# Patient Record
Sex: Male | Born: 1945 | Race: White | Hispanic: No | Marital: Married | Smoking: Never smoker
Health system: Southern US, Community
[De-identification: ages and names within clinical notes are randomized; demographics above are authoritative.]

## PROBLEM LIST (undated history)

## (undated) HISTORY — PX: OTHER SURGICAL HISTORY: SHX169

## (undated) HISTORY — PX: KNEE ARTHROSCOPY: SUR90

---

## 2004-08-31 ENCOUNTER — Encounter: Admission: RE | Admit: 2004-08-31 | Discharge: 2004-08-31 | Payer: Self-pay | Admitting: Unknown Physician Specialty

## 2008-06-20 ENCOUNTER — Encounter: Admission: RE | Admit: 2008-06-20 | Discharge: 2008-06-20 | Payer: Self-pay | Admitting: Unknown Physician Specialty

## 2012-05-11 DIAGNOSIS — R972 Elevated prostate specific antigen [PSA]: Secondary | ICD-10-CM

## 2012-05-11 HISTORY — DX: Elevated prostate specific antigen (PSA): R97.20

## 2014-01-20 ENCOUNTER — Other Ambulatory Visit: Payer: Self-pay | Admitting: Urology

## 2014-01-20 DIAGNOSIS — N183 Chronic kidney disease, stage 3 unspecified: Secondary | ICD-10-CM

## 2014-01-20 DIAGNOSIS — N200 Calculus of kidney: Secondary | ICD-10-CM

## 2014-01-20 HISTORY — DX: Chronic kidney disease, stage 3 unspecified: N18.30

## 2014-01-28 ENCOUNTER — Ambulatory Visit
Admission: RE | Admit: 2014-01-28 | Discharge: 2014-01-28 | Disposition: A | Discharge status: Home / Self Care | Payer: Medicare Other | Source: Ambulatory Visit | Attending: Urology | Admitting: Urology

## 2014-01-28 DIAGNOSIS — N200 Calculus of kidney: Secondary | ICD-10-CM

## 2014-03-04 DIAGNOSIS — R001 Bradycardia, unspecified: Secondary | ICD-10-CM

## 2014-03-04 HISTORY — DX: Bradycardia, unspecified: R00.1

## 2014-05-12 ENCOUNTER — Other Ambulatory Visit: Payer: Self-pay | Admitting: Urology

## 2014-05-12 DIAGNOSIS — N200 Calculus of kidney: Secondary | ICD-10-CM

## 2014-05-14 ENCOUNTER — Ambulatory Visit
Admission: RE | Admit: 2014-05-14 | Discharge: 2014-05-14 | Disposition: A | Discharge status: Home / Self Care | Payer: Medicare Other | Source: Ambulatory Visit | Attending: Urology | Admitting: Urology

## 2014-05-14 DIAGNOSIS — N200 Calculus of kidney: Secondary | ICD-10-CM

## 2014-11-10 DIAGNOSIS — N411 Chronic prostatitis: Secondary | ICD-10-CM | POA: Insufficient documentation

## 2014-11-10 DIAGNOSIS — N401 Enlarged prostate with lower urinary tract symptoms: Secondary | ICD-10-CM | POA: Insufficient documentation

## 2014-11-10 HISTORY — DX: Chronic prostatitis: N41.1

## 2014-11-10 HISTORY — DX: Benign prostatic hyperplasia with lower urinary tract symptoms: N40.1

## 2015-03-27 ENCOUNTER — Other Ambulatory Visit: Payer: Self-pay | Admitting: Specialist

## 2015-03-27 DIAGNOSIS — M5416 Radiculopathy, lumbar region: Secondary | ICD-10-CM

## 2015-03-30 ENCOUNTER — Other Ambulatory Visit: Payer: Self-pay | Admitting: Specialist

## 2015-03-30 DIAGNOSIS — M5416 Radiculopathy, lumbar region: Secondary | ICD-10-CM

## 2015-04-07 ENCOUNTER — Ambulatory Visit
Admission: RE | Admit: 2015-04-07 | Discharge: 2015-04-07 | Disposition: A | Discharge status: Home / Self Care | Payer: PPO | Source: Ambulatory Visit | Attending: Specialist | Admitting: Specialist

## 2015-04-07 DIAGNOSIS — M5416 Radiculopathy, lumbar region: Secondary | ICD-10-CM

## 2015-10-27 DIAGNOSIS — H5203 Hypermetropia, bilateral: Secondary | ICD-10-CM | POA: Diagnosis not present

## 2015-10-27 DIAGNOSIS — H2513 Age-related nuclear cataract, bilateral: Secondary | ICD-10-CM | POA: Diagnosis not present

## 2015-11-10 DIAGNOSIS — E559 Vitamin D deficiency, unspecified: Secondary | ICD-10-CM | POA: Diagnosis not present

## 2015-11-10 DIAGNOSIS — E531 Pyridoxine deficiency: Secondary | ICD-10-CM | POA: Diagnosis not present

## 2015-11-10 DIAGNOSIS — M6281 Muscle weakness (generalized): Secondary | ICD-10-CM | POA: Diagnosis not present

## 2015-11-10 DIAGNOSIS — G603 Idiopathic progressive neuropathy: Secondary | ICD-10-CM | POA: Diagnosis not present

## 2015-11-10 DIAGNOSIS — Z79899 Other long term (current) drug therapy: Secondary | ICD-10-CM | POA: Diagnosis not present

## 2015-11-10 DIAGNOSIS — M791 Myalgia: Secondary | ICD-10-CM | POA: Diagnosis not present

## 2015-11-10 DIAGNOSIS — M797 Fibromyalgia: Secondary | ICD-10-CM | POA: Diagnosis not present

## 2015-11-16 DIAGNOSIS — L821 Other seborrheic keratosis: Secondary | ICD-10-CM | POA: Diagnosis not present

## 2015-11-16 DIAGNOSIS — L02212 Cutaneous abscess of back [any part, except buttock]: Secondary | ICD-10-CM | POA: Diagnosis not present

## 2015-11-16 DIAGNOSIS — D1801 Hemangioma of skin and subcutaneous tissue: Secondary | ICD-10-CM | POA: Diagnosis not present

## 2015-11-23 DIAGNOSIS — N400 Enlarged prostate without lower urinary tract symptoms: Secondary | ICD-10-CM | POA: Diagnosis not present

## 2015-11-23 DIAGNOSIS — N183 Chronic kidney disease, stage 3 (moderate): Secondary | ICD-10-CM | POA: Diagnosis not present

## 2015-11-23 DIAGNOSIS — N200 Calculus of kidney: Secondary | ICD-10-CM | POA: Diagnosis not present

## 2015-11-23 DIAGNOSIS — R972 Elevated prostate specific antigen [PSA]: Secondary | ICD-10-CM | POA: Diagnosis not present

## 2015-12-09 DIAGNOSIS — R6889 Other general symptoms and signs: Secondary | ICD-10-CM | POA: Diagnosis not present

## 2015-12-22 DIAGNOSIS — G603 Idiopathic progressive neuropathy: Secondary | ICD-10-CM | POA: Diagnosis not present

## 2015-12-22 DIAGNOSIS — M791 Myalgia: Secondary | ICD-10-CM | POA: Diagnosis not present

## 2016-01-26 DIAGNOSIS — G603 Idiopathic progressive neuropathy: Secondary | ICD-10-CM | POA: Diagnosis not present

## 2016-01-26 DIAGNOSIS — M791 Myalgia: Secondary | ICD-10-CM | POA: Diagnosis not present

## 2016-01-26 DIAGNOSIS — G2581 Restless legs syndrome: Secondary | ICD-10-CM | POA: Diagnosis not present

## 2016-01-26 DIAGNOSIS — M5417 Radiculopathy, lumbosacral region: Secondary | ICD-10-CM | POA: Diagnosis not present

## 2016-02-26 DIAGNOSIS — H903 Sensorineural hearing loss, bilateral: Secondary | ICD-10-CM | POA: Diagnosis not present

## 2016-02-26 DIAGNOSIS — H9313 Tinnitus, bilateral: Secondary | ICD-10-CM | POA: Diagnosis not present

## 2016-02-26 DIAGNOSIS — H6123 Impacted cerumen, bilateral: Secondary | ICD-10-CM | POA: Diagnosis not present

## 2016-05-17 DIAGNOSIS — Z1211 Encounter for screening for malignant neoplasm of colon: Secondary | ICD-10-CM | POA: Diagnosis not present

## 2016-05-17 DIAGNOSIS — K210 Gastro-esophageal reflux disease with esophagitis: Secondary | ICD-10-CM | POA: Diagnosis not present

## 2016-05-27 DIAGNOSIS — K5730 Diverticulosis of large intestine without perforation or abscess without bleeding: Secondary | ICD-10-CM | POA: Diagnosis not present

## 2016-05-27 DIAGNOSIS — K219 Gastro-esophageal reflux disease without esophagitis: Secondary | ICD-10-CM | POA: Diagnosis not present

## 2016-05-27 DIAGNOSIS — K2900 Acute gastritis without bleeding: Secondary | ICD-10-CM | POA: Diagnosis not present

## 2016-05-27 DIAGNOSIS — K208 Other esophagitis: Secondary | ICD-10-CM | POA: Diagnosis not present

## 2016-05-27 DIAGNOSIS — Z1211 Encounter for screening for malignant neoplasm of colon: Secondary | ICD-10-CM | POA: Diagnosis not present

## 2016-05-27 DIAGNOSIS — K209 Esophagitis, unspecified: Secondary | ICD-10-CM | POA: Diagnosis not present

## 2016-05-27 DIAGNOSIS — K649 Unspecified hemorrhoids: Secondary | ICD-10-CM | POA: Diagnosis not present

## 2016-05-27 DIAGNOSIS — K641 Second degree hemorrhoids: Secondary | ICD-10-CM | POA: Diagnosis not present

## 2016-07-08 DIAGNOSIS — N183 Chronic kidney disease, stage 3 (moderate): Secondary | ICD-10-CM | POA: Diagnosis not present

## 2016-07-08 DIAGNOSIS — R972 Elevated prostate specific antigen [PSA]: Secondary | ICD-10-CM | POA: Diagnosis not present

## 2016-07-08 DIAGNOSIS — N401 Enlarged prostate with lower urinary tract symptoms: Secondary | ICD-10-CM | POA: Diagnosis not present

## 2016-07-08 DIAGNOSIS — N200 Calculus of kidney: Secondary | ICD-10-CM | POA: Diagnosis not present

## 2016-07-12 DIAGNOSIS — M159 Polyosteoarthritis, unspecified: Secondary | ICD-10-CM | POA: Diagnosis not present

## 2016-07-12 DIAGNOSIS — G2581 Restless legs syndrome: Secondary | ICD-10-CM | POA: Diagnosis not present

## 2016-07-12 DIAGNOSIS — N401 Enlarged prostate with lower urinary tract symptoms: Secondary | ICD-10-CM | POA: Diagnosis not present

## 2016-07-12 DIAGNOSIS — Z1159 Encounter for screening for other viral diseases: Secondary | ICD-10-CM | POA: Diagnosis not present

## 2016-07-12 DIAGNOSIS — Z125 Encounter for screening for malignant neoplasm of prostate: Secondary | ICD-10-CM | POA: Diagnosis not present

## 2016-07-12 DIAGNOSIS — D509 Iron deficiency anemia, unspecified: Secondary | ICD-10-CM | POA: Diagnosis not present

## 2016-07-12 DIAGNOSIS — Z9181 History of falling: Secondary | ICD-10-CM | POA: Diagnosis not present

## 2016-07-12 DIAGNOSIS — N181 Chronic kidney disease, stage 1: Secondary | ICD-10-CM | POA: Diagnosis not present

## 2016-07-12 DIAGNOSIS — K210 Gastro-esophageal reflux disease with esophagitis: Secondary | ICD-10-CM | POA: Diagnosis not present

## 2016-07-12 DIAGNOSIS — Z0000 Encounter for general adult medical examination without abnormal findings: Secondary | ICD-10-CM | POA: Diagnosis not present

## 2016-07-12 DIAGNOSIS — Z23 Encounter for immunization: Secondary | ICD-10-CM | POA: Diagnosis not present

## 2016-07-12 DIAGNOSIS — E782 Mixed hyperlipidemia: Secondary | ICD-10-CM | POA: Diagnosis not present

## 2016-07-12 DIAGNOSIS — G609 Hereditary and idiopathic neuropathy, unspecified: Secondary | ICD-10-CM | POA: Diagnosis not present

## 2016-08-09 DIAGNOSIS — G2581 Restless legs syndrome: Secondary | ICD-10-CM | POA: Diagnosis not present

## 2016-08-09 DIAGNOSIS — M5417 Radiculopathy, lumbosacral region: Secondary | ICD-10-CM | POA: Diagnosis not present

## 2016-08-09 DIAGNOSIS — G603 Idiopathic progressive neuropathy: Secondary | ICD-10-CM | POA: Diagnosis not present

## 2016-08-09 DIAGNOSIS — M791 Myalgia: Secondary | ICD-10-CM | POA: Diagnosis not present

## 2016-08-16 DIAGNOSIS — M545 Low back pain: Secondary | ICD-10-CM | POA: Diagnosis not present

## 2016-08-16 DIAGNOSIS — G603 Idiopathic progressive neuropathy: Secondary | ICD-10-CM | POA: Diagnosis not present

## 2016-08-16 DIAGNOSIS — G2581 Restless legs syndrome: Secondary | ICD-10-CM | POA: Diagnosis not present

## 2016-08-16 DIAGNOSIS — M791 Myalgia: Secondary | ICD-10-CM | POA: Diagnosis not present

## 2016-08-16 DIAGNOSIS — M5417 Radiculopathy, lumbosacral region: Secondary | ICD-10-CM | POA: Diagnosis not present

## 2016-08-18 DIAGNOSIS — Z0000 Encounter for general adult medical examination without abnormal findings: Secondary | ICD-10-CM | POA: Diagnosis not present

## 2016-08-24 DIAGNOSIS — Z23 Encounter for immunization: Secondary | ICD-10-CM | POA: Diagnosis not present

## 2016-10-11 DIAGNOSIS — H4301 Vitreous prolapse, right eye: Secondary | ICD-10-CM | POA: Diagnosis not present

## 2016-10-11 DIAGNOSIS — G603 Idiopathic progressive neuropathy: Secondary | ICD-10-CM | POA: Diagnosis not present

## 2016-10-11 DIAGNOSIS — M5417 Radiculopathy, lumbosacral region: Secondary | ICD-10-CM | POA: Diagnosis not present

## 2016-10-11 DIAGNOSIS — M791 Myalgia: Secondary | ICD-10-CM | POA: Diagnosis not present

## 2016-10-11 DIAGNOSIS — G2581 Restless legs syndrome: Secondary | ICD-10-CM | POA: Diagnosis not present

## 2016-11-03 DIAGNOSIS — H4301 Vitreous prolapse, right eye: Secondary | ICD-10-CM | POA: Diagnosis not present

## 2016-11-28 IMAGING — CR DG LUMBAR SPINE 2-3V
2 series · 2 of 2 positions shown · non-contrast
Comparison: None

CLINICAL DATA: Worsening lumbar radiculopathy, worsening symptoms,
history disc herniation

EXAM:
LUMBAR SPINE - 2-3 VIEW

[w lumbar spine ap]
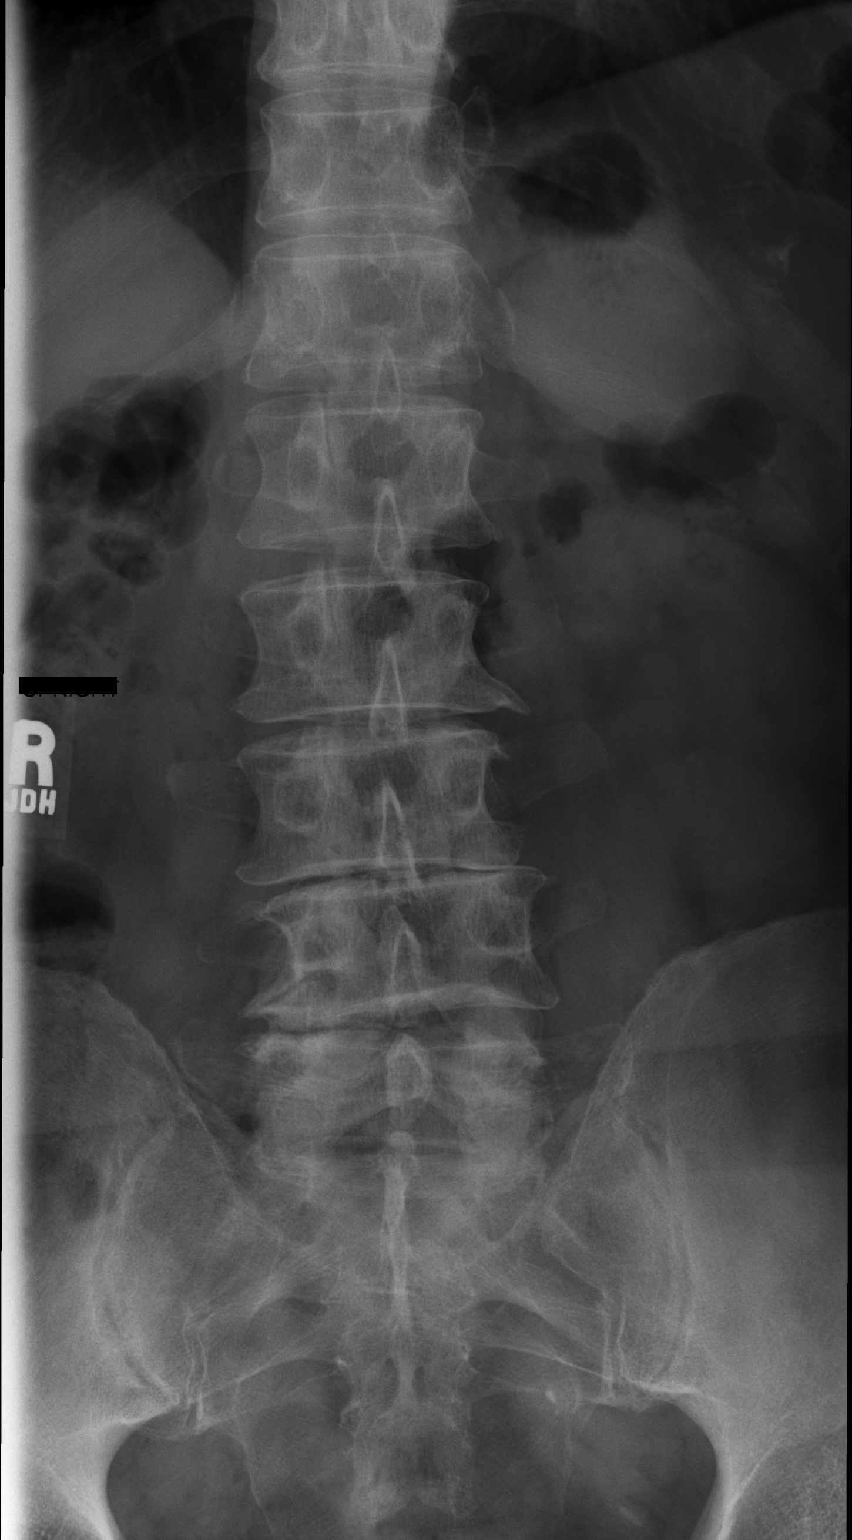

[w lumbar spine lat]
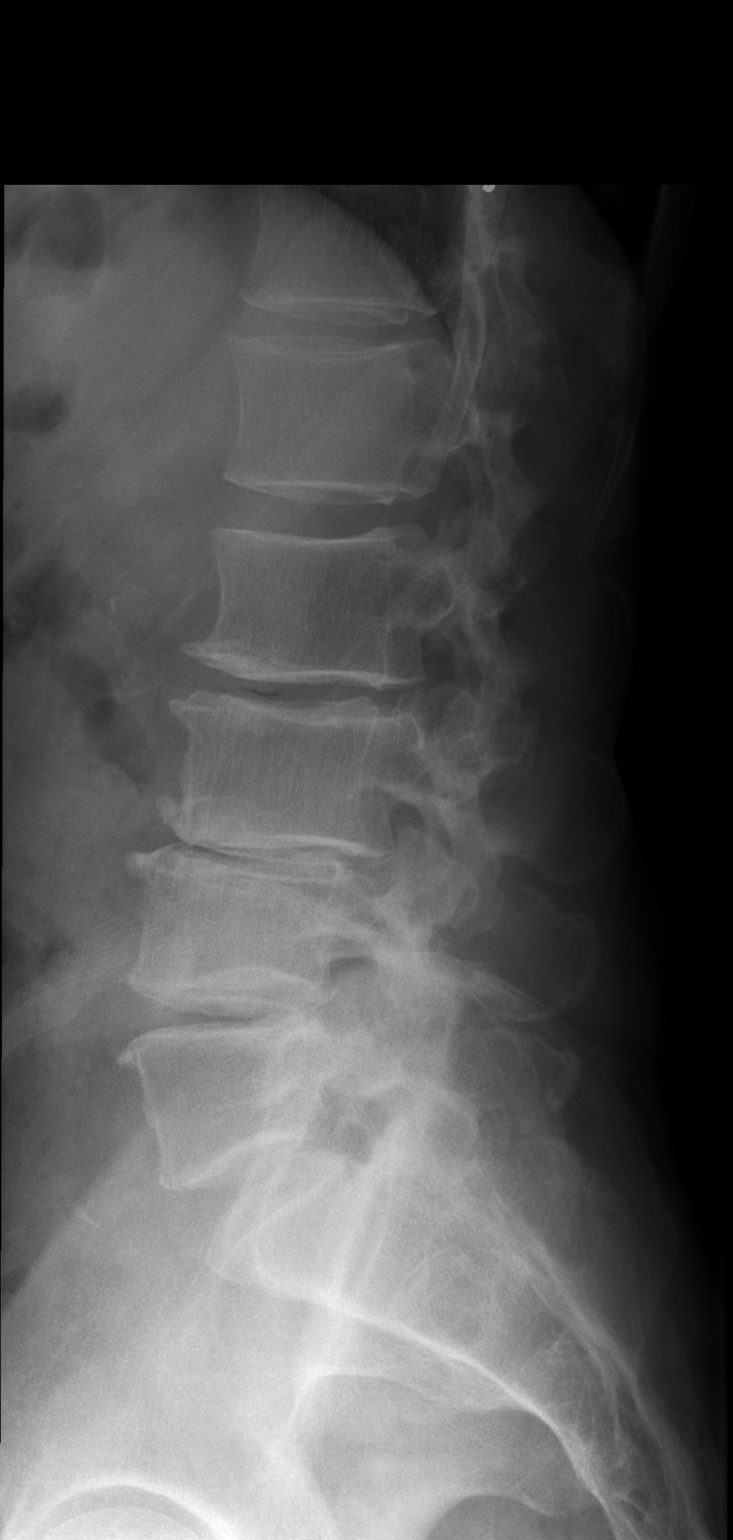

[2 of 2 positions shown; findings below may reference images not displayed]

FINDINGS: Five nonrib-bearing lumbar type vertebra.

Osseous demineralization.

Diffuse disk space narrowing and endplate spur formation.

Vertebral body heights maintained.

No acute fracture or bone destruction.

Minimal retrolisthesis L3-L4.

No spondylolysis.

Question mild sclerosis at RIGHT SI joint versus LEFT.
IMPRESSION: Multilevel degenerative disc disease changes.

Question mild asymmetric RIGHT sacroiliitis.

## 2016-12-13 DIAGNOSIS — G603 Idiopathic progressive neuropathy: Secondary | ICD-10-CM | POA: Diagnosis not present

## 2016-12-13 DIAGNOSIS — G2581 Restless legs syndrome: Secondary | ICD-10-CM | POA: Diagnosis not present

## 2016-12-13 DIAGNOSIS — M791 Myalgia: Secondary | ICD-10-CM | POA: Diagnosis not present

## 2016-12-13 DIAGNOSIS — M5417 Radiculopathy, lumbosacral region: Secondary | ICD-10-CM | POA: Diagnosis not present

## 2017-01-23 DIAGNOSIS — R972 Elevated prostate specific antigen [PSA]: Secondary | ICD-10-CM | POA: Diagnosis not present

## 2017-01-23 DIAGNOSIS — N200 Calculus of kidney: Secondary | ICD-10-CM | POA: Diagnosis not present

## 2017-01-23 DIAGNOSIS — N183 Chronic kidney disease, stage 3 (moderate): Secondary | ICD-10-CM | POA: Diagnosis not present

## 2017-01-23 DIAGNOSIS — N400 Enlarged prostate without lower urinary tract symptoms: Secondary | ICD-10-CM | POA: Diagnosis not present

## 2017-03-13 DIAGNOSIS — R55 Syncope and collapse: Secondary | ICD-10-CM | POA: Diagnosis not present

## 2017-03-13 DIAGNOSIS — R05 Cough: Secondary | ICD-10-CM | POA: Diagnosis not present

## 2017-03-22 DIAGNOSIS — G459 Transient cerebral ischemic attack, unspecified: Secondary | ICD-10-CM | POA: Diagnosis not present

## 2017-03-22 DIAGNOSIS — G603 Idiopathic progressive neuropathy: Secondary | ICD-10-CM | POA: Diagnosis not present

## 2017-03-22 DIAGNOSIS — M545 Low back pain: Secondary | ICD-10-CM | POA: Diagnosis not present

## 2017-03-22 DIAGNOSIS — R55 Syncope and collapse: Secondary | ICD-10-CM | POA: Diagnosis not present

## 2017-03-24 DIAGNOSIS — R55 Syncope and collapse: Secondary | ICD-10-CM | POA: Diagnosis not present

## 2017-03-28 ENCOUNTER — Other Ambulatory Visit: Payer: Self-pay | Admitting: Specialist

## 2017-03-28 DIAGNOSIS — Z77018 Contact with and (suspected) exposure to other hazardous metals: Secondary | ICD-10-CM

## 2017-03-28 DIAGNOSIS — R55 Syncope and collapse: Secondary | ICD-10-CM

## 2017-03-28 DIAGNOSIS — G459 Transient cerebral ischemic attack, unspecified: Secondary | ICD-10-CM

## 2017-04-05 DIAGNOSIS — G459 Transient cerebral ischemic attack, unspecified: Secondary | ICD-10-CM | POA: Diagnosis not present

## 2017-04-05 DIAGNOSIS — G35 Multiple sclerosis: Secondary | ICD-10-CM | POA: Diagnosis not present

## 2017-04-05 DIAGNOSIS — Z01818 Encounter for other preprocedural examination: Secondary | ICD-10-CM | POA: Diagnosis not present

## 2017-04-05 DIAGNOSIS — R55 Syncope and collapse: Secondary | ICD-10-CM | POA: Diagnosis not present

## 2017-04-07 DIAGNOSIS — G2581 Restless legs syndrome: Secondary | ICD-10-CM | POA: Diagnosis not present

## 2017-04-07 DIAGNOSIS — M545 Low back pain: Secondary | ICD-10-CM | POA: Diagnosis not present

## 2017-04-07 DIAGNOSIS — H8113 Benign paroxysmal vertigo, bilateral: Secondary | ICD-10-CM | POA: Diagnosis not present

## 2017-04-07 DIAGNOSIS — G459 Transient cerebral ischemic attack, unspecified: Secondary | ICD-10-CM | POA: Diagnosis not present

## 2017-04-07 DIAGNOSIS — R55 Syncope and collapse: Secondary | ICD-10-CM | POA: Diagnosis not present

## 2017-04-07 DIAGNOSIS — G603 Idiopathic progressive neuropathy: Secondary | ICD-10-CM | POA: Diagnosis not present

## 2017-05-09 DIAGNOSIS — R55 Syncope and collapse: Secondary | ICD-10-CM | POA: Diagnosis not present

## 2017-05-09 DIAGNOSIS — I253 Aneurysm of heart: Secondary | ICD-10-CM | POA: Diagnosis not present

## 2017-05-09 DIAGNOSIS — I517 Cardiomegaly: Secondary | ICD-10-CM | POA: Diagnosis not present

## 2017-05-09 DIAGNOSIS — Q211 Atrial septal defect: Secondary | ICD-10-CM | POA: Diagnosis not present

## 2017-05-09 DIAGNOSIS — I519 Heart disease, unspecified: Secondary | ICD-10-CM | POA: Diagnosis not present

## 2017-05-09 DIAGNOSIS — I083 Combined rheumatic disorders of mitral, aortic and tricuspid valves: Secondary | ICD-10-CM | POA: Diagnosis not present

## 2017-05-22 DIAGNOSIS — R972 Elevated prostate specific antigen [PSA]: Secondary | ICD-10-CM | POA: Diagnosis not present

## 2017-05-22 DIAGNOSIS — E782 Mixed hyperlipidemia: Secondary | ICD-10-CM | POA: Diagnosis not present

## 2017-05-22 DIAGNOSIS — J302 Other seasonal allergic rhinitis: Secondary | ICD-10-CM | POA: Diagnosis not present

## 2017-05-22 DIAGNOSIS — Z0000 Encounter for general adult medical examination without abnormal findings: Secondary | ICD-10-CM | POA: Diagnosis not present

## 2017-05-22 DIAGNOSIS — N401 Enlarged prostate with lower urinary tract symptoms: Secondary | ICD-10-CM | POA: Diagnosis not present

## 2017-05-31 DIAGNOSIS — R5381 Other malaise: Secondary | ICD-10-CM | POA: Diagnosis not present

## 2017-05-31 DIAGNOSIS — Z125 Encounter for screening for malignant neoplasm of prostate: Secondary | ICD-10-CM | POA: Diagnosis not present

## 2017-05-31 DIAGNOSIS — E559 Vitamin D deficiency, unspecified: Secondary | ICD-10-CM | POA: Diagnosis not present

## 2017-05-31 DIAGNOSIS — E782 Mixed hyperlipidemia: Secondary | ICD-10-CM | POA: Diagnosis not present

## 2017-05-31 DIAGNOSIS — R972 Elevated prostate specific antigen [PSA]: Secondary | ICD-10-CM | POA: Diagnosis not present

## 2017-05-31 DIAGNOSIS — R5383 Other fatigue: Secondary | ICD-10-CM | POA: Diagnosis not present

## 2017-06-06 DIAGNOSIS — M545 Low back pain: Secondary | ICD-10-CM | POA: Diagnosis not present

## 2017-06-06 DIAGNOSIS — G2581 Restless legs syndrome: Secondary | ICD-10-CM | POA: Diagnosis not present

## 2017-06-06 DIAGNOSIS — G603 Idiopathic progressive neuropathy: Secondary | ICD-10-CM | POA: Diagnosis not present

## 2017-06-06 DIAGNOSIS — R55 Syncope and collapse: Secondary | ICD-10-CM | POA: Diagnosis not present

## 2017-06-21 DIAGNOSIS — H524 Presbyopia: Secondary | ICD-10-CM | POA: Diagnosis not present

## 2017-06-27 DIAGNOSIS — R002 Palpitations: Secondary | ICD-10-CM

## 2017-06-27 HISTORY — DX: Palpitations: R00.2

## 2017-07-14 DIAGNOSIS — Z23 Encounter for immunization: Secondary | ICD-10-CM | POA: Diagnosis not present

## 2017-08-01 DIAGNOSIS — Z8673 Personal history of transient ischemic attack (TIA), and cerebral infarction without residual deficits: Secondary | ICD-10-CM | POA: Diagnosis not present

## 2017-08-01 DIAGNOSIS — G603 Idiopathic progressive neuropathy: Secondary | ICD-10-CM | POA: Diagnosis not present

## 2017-08-01 DIAGNOSIS — M5442 Lumbago with sciatica, left side: Secondary | ICD-10-CM | POA: Diagnosis not present

## 2017-08-01 DIAGNOSIS — M5441 Lumbago with sciatica, right side: Secondary | ICD-10-CM | POA: Diagnosis not present

## 2017-08-01 DIAGNOSIS — R55 Syncope and collapse: Secondary | ICD-10-CM | POA: Diagnosis not present

## 2017-08-08 DIAGNOSIS — M5442 Lumbago with sciatica, left side: Secondary | ICD-10-CM | POA: Diagnosis not present

## 2017-08-08 DIAGNOSIS — Z79899 Other long term (current) drug therapy: Secondary | ICD-10-CM | POA: Diagnosis not present

## 2017-08-08 DIAGNOSIS — M5441 Lumbago with sciatica, right side: Secondary | ICD-10-CM | POA: Diagnosis not present

## 2017-08-08 DIAGNOSIS — G6181 Chronic inflammatory demyelinating polyneuritis: Secondary | ICD-10-CM | POA: Diagnosis not present

## 2017-08-08 DIAGNOSIS — E538 Deficiency of other specified B group vitamins: Secondary | ICD-10-CM | POA: Diagnosis not present

## 2017-08-08 DIAGNOSIS — E559 Vitamin D deficiency, unspecified: Secondary | ICD-10-CM | POA: Diagnosis not present

## 2017-08-08 DIAGNOSIS — E531 Pyridoxine deficiency: Secondary | ICD-10-CM | POA: Diagnosis not present

## 2017-08-08 DIAGNOSIS — E672 Megavitamin-B6 syndrome: Secondary | ICD-10-CM | POA: Diagnosis not present

## 2017-08-08 DIAGNOSIS — R55 Syncope and collapse: Secondary | ICD-10-CM | POA: Diagnosis not present

## 2017-08-17 DIAGNOSIS — I471 Supraventricular tachycardia: Secondary | ICD-10-CM | POA: Diagnosis not present

## 2017-08-17 DIAGNOSIS — R002 Palpitations: Secondary | ICD-10-CM | POA: Diagnosis not present

## 2017-08-17 DIAGNOSIS — K219 Gastro-esophageal reflux disease without esophagitis: Secondary | ICD-10-CM

## 2017-08-17 HISTORY — DX: Gastro-esophageal reflux disease without esophagitis: K21.9

## 2017-09-19 DIAGNOSIS — Z79899 Other long term (current) drug therapy: Secondary | ICD-10-CM | POA: Diagnosis not present

## 2017-09-19 DIAGNOSIS — Z882 Allergy status to sulfonamides status: Secondary | ICD-10-CM | POA: Diagnosis not present

## 2017-09-19 DIAGNOSIS — I4510 Unspecified right bundle-branch block: Secondary | ICD-10-CM | POA: Diagnosis not present

## 2017-09-19 DIAGNOSIS — G629 Polyneuropathy, unspecified: Secondary | ICD-10-CM | POA: Diagnosis not present

## 2017-09-19 DIAGNOSIS — K219 Gastro-esophageal reflux disease without esophagitis: Secondary | ICD-10-CM | POA: Diagnosis not present

## 2017-09-19 DIAGNOSIS — I471 Supraventricular tachycardia: Secondary | ICD-10-CM | POA: Diagnosis not present

## 2017-09-19 DIAGNOSIS — Z8679 Personal history of other diseases of the circulatory system: Secondary | ICD-10-CM

## 2017-09-19 DIAGNOSIS — R55 Syncope and collapse: Secondary | ICD-10-CM | POA: Diagnosis not present

## 2017-09-19 DIAGNOSIS — Z7982 Long term (current) use of aspirin: Secondary | ICD-10-CM | POA: Diagnosis not present

## 2017-09-19 DIAGNOSIS — Z9889 Other specified postprocedural states: Secondary | ICD-10-CM | POA: Insufficient documentation

## 2017-09-19 DIAGNOSIS — Z881 Allergy status to other antibiotic agents status: Secondary | ICD-10-CM | POA: Diagnosis not present

## 2017-09-19 HISTORY — DX: Personal history of other diseases of the circulatory system: Z86.79

## 2017-09-20 DIAGNOSIS — Z9889 Other specified postprocedural states: Secondary | ICD-10-CM | POA: Diagnosis not present

## 2017-09-20 DIAGNOSIS — Z8679 Personal history of other diseases of the circulatory system: Secondary | ICD-10-CM | POA: Diagnosis not present

## 2017-09-20 DIAGNOSIS — I471 Supraventricular tachycardia: Secondary | ICD-10-CM | POA: Diagnosis not present

## 2017-10-06 DIAGNOSIS — Z9889 Other specified postprocedural states: Secondary | ICD-10-CM | POA: Diagnosis not present

## 2017-10-06 DIAGNOSIS — Z8679 Personal history of other diseases of the circulatory system: Secondary | ICD-10-CM | POA: Diagnosis not present

## 2017-10-26 DIAGNOSIS — L814 Other melanin hyperpigmentation: Secondary | ICD-10-CM | POA: Diagnosis not present

## 2017-10-26 DIAGNOSIS — L720 Epidermal cyst: Secondary | ICD-10-CM | POA: Diagnosis not present

## 2017-10-26 DIAGNOSIS — L299 Pruritus, unspecified: Secondary | ICD-10-CM | POA: Diagnosis not present

## 2017-10-31 DIAGNOSIS — M5442 Lumbago with sciatica, left side: Secondary | ICD-10-CM | POA: Diagnosis not present

## 2017-10-31 DIAGNOSIS — R55 Syncope and collapse: Secondary | ICD-10-CM | POA: Diagnosis not present

## 2017-10-31 DIAGNOSIS — G459 Transient cerebral ischemic attack, unspecified: Secondary | ICD-10-CM | POA: Diagnosis not present

## 2017-10-31 DIAGNOSIS — M5441 Lumbago with sciatica, right side: Secondary | ICD-10-CM | POA: Diagnosis not present

## 2017-11-14 DIAGNOSIS — L720 Epidermal cyst: Secondary | ICD-10-CM | POA: Diagnosis not present

## 2017-11-21 DIAGNOSIS — H903 Sensorineural hearing loss, bilateral: Secondary | ICD-10-CM | POA: Diagnosis not present

## 2017-11-28 DIAGNOSIS — M5441 Lumbago with sciatica, right side: Secondary | ICD-10-CM | POA: Diagnosis not present

## 2017-11-28 DIAGNOSIS — R55 Syncope and collapse: Secondary | ICD-10-CM | POA: Diagnosis not present

## 2017-11-28 DIAGNOSIS — M5442 Lumbago with sciatica, left side: Secondary | ICD-10-CM | POA: Diagnosis not present

## 2017-11-28 DIAGNOSIS — G459 Transient cerebral ischemic attack, unspecified: Secondary | ICD-10-CM | POA: Diagnosis not present

## 2018-01-31 DIAGNOSIS — Z9889 Other specified postprocedural states: Secondary | ICD-10-CM | POA: Diagnosis not present

## 2018-01-31 DIAGNOSIS — R5383 Other fatigue: Secondary | ICD-10-CM | POA: Diagnosis not present

## 2018-01-31 DIAGNOSIS — R55 Syncope and collapse: Secondary | ICD-10-CM | POA: Diagnosis not present

## 2018-01-31 DIAGNOSIS — I471 Supraventricular tachycardia: Secondary | ICD-10-CM | POA: Diagnosis not present

## 2018-01-31 DIAGNOSIS — Z8679 Personal history of other diseases of the circulatory system: Secondary | ICD-10-CM | POA: Diagnosis not present

## 2018-02-02 DIAGNOSIS — N411 Chronic prostatitis: Secondary | ICD-10-CM | POA: Diagnosis not present

## 2018-02-02 DIAGNOSIS — N183 Chronic kidney disease, stage 3 (moderate): Secondary | ICD-10-CM | POA: Diagnosis not present

## 2018-02-02 DIAGNOSIS — N200 Calculus of kidney: Secondary | ICD-10-CM | POA: Diagnosis not present

## 2018-02-02 DIAGNOSIS — N401 Enlarged prostate with lower urinary tract symptoms: Secondary | ICD-10-CM | POA: Diagnosis not present

## 2018-02-13 DIAGNOSIS — M25511 Pain in right shoulder: Secondary | ICD-10-CM

## 2018-02-13 DIAGNOSIS — G8929 Other chronic pain: Secondary | ICD-10-CM

## 2018-02-13 DIAGNOSIS — M25512 Pain in left shoulder: Secondary | ICD-10-CM | POA: Diagnosis not present

## 2018-02-13 HISTORY — DX: Other chronic pain: G89.29

## 2018-02-21 DIAGNOSIS — I10 Essential (primary) hypertension: Secondary | ICD-10-CM | POA: Diagnosis not present

## 2018-02-21 DIAGNOSIS — I471 Supraventricular tachycardia: Secondary | ICD-10-CM | POA: Diagnosis not present

## 2018-02-21 DIAGNOSIS — I491 Atrial premature depolarization: Secondary | ICD-10-CM | POA: Diagnosis not present

## 2018-03-06 DIAGNOSIS — R55 Syncope and collapse: Secondary | ICD-10-CM | POA: Diagnosis not present

## 2018-03-06 DIAGNOSIS — M5441 Lumbago with sciatica, right side: Secondary | ICD-10-CM | POA: Diagnosis not present

## 2018-03-06 DIAGNOSIS — M5442 Lumbago with sciatica, left side: Secondary | ICD-10-CM | POA: Diagnosis not present

## 2018-03-06 DIAGNOSIS — G459 Transient cerebral ischemic attack, unspecified: Secondary | ICD-10-CM | POA: Diagnosis not present

## 2018-03-16 DIAGNOSIS — Z8679 Personal history of other diseases of the circulatory system: Secondary | ICD-10-CM | POA: Diagnosis not present

## 2018-03-16 DIAGNOSIS — Z9889 Other specified postprocedural states: Secondary | ICD-10-CM | POA: Diagnosis not present

## 2018-03-16 DIAGNOSIS — R5383 Other fatigue: Secondary | ICD-10-CM | POA: Insufficient documentation

## 2018-03-16 DIAGNOSIS — R0609 Other forms of dyspnea: Secondary | ICD-10-CM

## 2018-03-16 HISTORY — DX: Other forms of dyspnea: R06.09

## 2018-03-16 HISTORY — DX: Other fatigue: R53.83

## 2018-04-19 DIAGNOSIS — H10413 Chronic giant papillary conjunctivitis, bilateral: Secondary | ICD-10-CM | POA: Diagnosis not present

## 2018-04-23 DIAGNOSIS — R0609 Other forms of dyspnea: Secondary | ICD-10-CM | POA: Diagnosis not present

## 2018-05-09 DIAGNOSIS — E782 Mixed hyperlipidemia: Secondary | ICD-10-CM

## 2018-05-09 DIAGNOSIS — N401 Enlarged prostate with lower urinary tract symptoms: Secondary | ICD-10-CM | POA: Diagnosis not present

## 2018-05-09 DIAGNOSIS — R351 Nocturia: Secondary | ICD-10-CM | POA: Diagnosis not present

## 2018-05-09 DIAGNOSIS — N183 Chronic kidney disease, stage 3 (moderate): Secondary | ICD-10-CM | POA: Diagnosis not present

## 2018-05-09 DIAGNOSIS — R7309 Other abnormal glucose: Secondary | ICD-10-CM | POA: Diagnosis not present

## 2018-05-09 DIAGNOSIS — Z0000 Encounter for general adult medical examination without abnormal findings: Secondary | ICD-10-CM | POA: Diagnosis not present

## 2018-05-09 DIAGNOSIS — Z1211 Encounter for screening for malignant neoplasm of colon: Secondary | ICD-10-CM | POA: Diagnosis not present

## 2018-05-09 DIAGNOSIS — Z79899 Other long term (current) drug therapy: Secondary | ICD-10-CM | POA: Diagnosis not present

## 2018-05-09 HISTORY — DX: Mixed hyperlipidemia: E78.2

## 2018-05-14 DIAGNOSIS — I4719 Other supraventricular tachycardia: Secondary | ICD-10-CM | POA: Insufficient documentation

## 2018-05-14 DIAGNOSIS — I471 Supraventricular tachycardia: Secondary | ICD-10-CM | POA: Insufficient documentation

## 2018-05-14 HISTORY — DX: Supraventricular tachycardia: I47.1

## 2018-05-14 HISTORY — DX: Other supraventricular tachycardia: I47.19

## 2018-05-15 DIAGNOSIS — I471 Supraventricular tachycardia: Secondary | ICD-10-CM | POA: Diagnosis not present

## 2018-05-22 DIAGNOSIS — H903 Sensorineural hearing loss, bilateral: Secondary | ICD-10-CM | POA: Diagnosis not present

## 2018-06-01 ENCOUNTER — Other Ambulatory Visit: Payer: Self-pay

## 2018-06-01 DIAGNOSIS — R972 Elevated prostate specific antigen [PSA]: Secondary | ICD-10-CM | POA: Diagnosis not present

## 2018-06-01 NOTE — Patient Outreach (Signed)
[place] [facility]) Care Management  06/01/2018  CHARBEL LOS 11/11/1945 [id number]   Referral Date: 06/01/18 Referral Source: HTA Concierge Referral Reason: Patient in doughnut hole and having problems affording lyrica   Outreach Attempt:spoke with patient. He is able to verify HIPAA.  Discussed reason for referral. Patient states that he is having problems affording lyrica.  He states that medication will be over $100 as he is in the doughnut hole.    Discussed with patient [facility] services and how we can support him.  He is agreeable to pharmacy referral for medication assistance.    Patient lives in the home with spouse and is independent with care.  Patient states that he sees his PCP regularly.  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  Patient voices no other concerns at this time.     Plan: RN CM will refer patient to pharmacy for patient assistance.  Jone Baseman, RN, MSN [facility] Care Management Care Management Coordinator Direct Line [phone] Toll Free: [phone]  Fax: [phone]

## 2018-06-04 ENCOUNTER — Other Ambulatory Visit: Payer: Self-pay | Admitting: Pharmacist

## 2018-06-04 ENCOUNTER — Ambulatory Visit: Payer: Self-pay | Admitting: Pharmacist

## 2018-06-04 NOTE — Patient Outreach (Addendum)
[place] [facility]) Care Management  06/04/2018  KAMAURY CUTBIRTH 12/27/1945 [id number]   Unsuccessful outreach attempt #1 to Mr. Arch regarding medication assistance.  HIPAA compliant message left on home phone encouraging callback.  Plan:  -I will plan to f/u with patient within 4 business days -Unsuccessful outreach letter sent  Regina Eck, PharmD, [place]  [phone]

## 2018-06-05 DIAGNOSIS — E538 Deficiency of other specified B group vitamins: Secondary | ICD-10-CM | POA: Diagnosis not present

## 2018-06-05 DIAGNOSIS — G901 Familial dysautonomia [Riley-Day]: Secondary | ICD-10-CM | POA: Diagnosis not present

## 2018-06-05 DIAGNOSIS — M5441 Lumbago with sciatica, right side: Secondary | ICD-10-CM | POA: Diagnosis not present

## 2018-06-05 DIAGNOSIS — E559 Vitamin D deficiency, unspecified: Secondary | ICD-10-CM | POA: Diagnosis not present

## 2018-06-05 DIAGNOSIS — G603 Idiopathic progressive neuropathy: Secondary | ICD-10-CM | POA: Diagnosis not present

## 2018-06-05 DIAGNOSIS — M5442 Lumbago with sciatica, left side: Secondary | ICD-10-CM | POA: Diagnosis not present

## 2018-06-05 DIAGNOSIS — Z79899 Other long term (current) drug therapy: Secondary | ICD-10-CM | POA: Diagnosis not present

## 2018-06-05 DIAGNOSIS — E531 Pyridoxine deficiency: Secondary | ICD-10-CM | POA: Diagnosis not present

## 2018-06-07 ENCOUNTER — Ambulatory Visit: Payer: Self-pay | Admitting: Pharmacist

## 2018-06-07 DIAGNOSIS — I10 Essential (primary) hypertension: Secondary | ICD-10-CM

## 2018-06-07 DIAGNOSIS — I951 Orthostatic hypotension: Secondary | ICD-10-CM | POA: Diagnosis not present

## 2018-06-07 HISTORY — DX: Essential (primary) hypertension: I10

## 2018-06-08 ENCOUNTER — Other Ambulatory Visit: Payer: Self-pay | Admitting: Pharmacy Technician

## 2018-06-08 ENCOUNTER — Ambulatory Visit: Payer: Self-pay | Admitting: Pharmacist

## 2018-06-08 ENCOUNTER — Other Ambulatory Visit: Payer: Self-pay | Admitting: Pharmacist

## 2018-06-08 NOTE — Patient Outreach (Signed)
[place] [facility]) Care Management  06/08/2018  Christopher Mclean 08/21/1946 [id number]   Received [employer] patient assistance referral from [place] for Lyrica. Prepared patient portion to be mailed and faxed provider portion to Dr. Unk Lightning.  Will follow up with patient in 5-7 business days to confirm application has been received.  Maud Deed [place], [place] Management [phone]

## 2018-06-08 NOTE — Patient Outreach (Signed)
[facility] [facility]) Care Management  06/08/2018  Christopher Mclean 1946-04-22 [id number]   72 year old male referred to [place] [facility] for medication assistance.  PMH significant for: HTN, GERD, CKD3, Chronic shoulder pain.  Incoming call from Christopher Mclean with HIPAA identifiers verified.  Patient is eager to receive medication asistance for Lyrica.  Lyrica works well for this patient in regards to pain control.  Of note, patient is not willing to try gabapentin as he has not had success in the past.  He states he is currently paying $109/1 month supply of Lyrica and is in the coverage gap.  Lyrica is generic now and available, however [employer] still offers brand name patient assistance so we will proceed with the application process.  Medications Reviewed Today    Reviewed by Lavera Guise, [facility] (Pharmacist) on 06/12/18 at 20  Med List Status: <None>  Medication Order Taking? Sig Documenting Provider Last Dose Status Informant  aspirin EC 81 MG tablet [id number] Yes Take 81 mg by mouth daily. [provider] Taking Active   cetirizine (ZYRTEC) 10 MG tablet [id number] Yes Take 10 mg by mouth daily.  [provider] Taking Active   Cholecalciferol (VITAMIN D) 2000 units tablet [id number] Yes Take 2,000 Units by mouth daily.  [provider] Taking Active   docusate sodium (COLACE) 100 MG capsule [id number] Yes Take 100 mg by mouth daily as needed.  [provider] Taking Active   fenofibrate micronized (LOFIBRA) 134 MG capsule [id number] Yes Take 134 mg by mouth daily before breakfast.  [provider] Taking Active   meloxicam (MOBIC) 7.5 MG tablet [id number] Yes Take 7.5 mg by mouth daily.  [provider] Taking Active   montelukast (SINGULAIR) 10 MG tablet [id number] Yes Take 10 mg by mouth at bedtime.  [provider] Taking Active   nortriptyline (PAMELOR) 10 MG capsule [id number] Yes Take 10 mg by mouth at bedtime.  [provider]  Taking Active   pantoprazole (PROTONIX) 40 MG tablet [id number] Yes Take 40 mg by mouth daily.  [provider] Taking Active   pregabalin (LYRICA) 75 MG capsule [id number] Yes Take 75 mg by mouth 2 (two) times daily.  [provider] Taking Active          Drugs sorted by system:  Cardiovascular: aspirin, fenofibrate  Pulmonary/Allergy: montelukast, cetirizine  Gastrointestinal: pantoprazole, docusate PRN  Pain: Lyrica, nortriptyline, meloxicam  Vitamins/Minerals: vitD   Medications to avoid in the elderly:  Meloxicam-Management Level Qualifier Per the Beers list, this medication should be avoided in the elderly because it increases the risk of gastrointestinal bleeding and peptic ulcer disease in high risk groups. Per the Beers List, avoid use in elderly patients with heart failure because the potential to promote fluid retention may exacerbate heart failure. Per the Beers list, avoid in elderly patients with a history of gastric or duodenal ulcers unless other alternatives are not effective and patient can take gastroprotective agent since it may exacerbate existing ulcers or precipitate ulcers. Per the Beers list, avoid in elderly patients with chronic kidney disease Stage IV or V since the risk of kidney injury may be increased.  Assessment:  Medication assistance:  Patient unable to affordthe following medication  1.[place] program reviewed with patient.  -Extra Help:after review of patient's income-->patient does not qualify for LIS/extra help -PAP: Programme researcher, broadcasting/film/video.Patient meets income requirements and no TROOP required.I reviewed with the patient that PAP application will be mailed to him  in an HTA envelope and how to fill our and mail back to [facility].   Plan: -I will route PAP letter to Etter Sjogren, [facility] pharmacy technicianwho will assist with contacting provider & patient to obtain necessary application  requirements -I will followupwith patient in1-2 weeks to ensure PAP applications have arrived  Regina Eck, PharmD, [place]  [phone]

## 2018-06-20 ENCOUNTER — Other Ambulatory Visit: Payer: Self-pay | Admitting: Pharmacy Technician

## 2018-06-20 NOTE — Patient Outreach (Signed)
[place] [facility]) Care Management  06/20/2018  JAQUAN SADOWSKY Oct 17, 1945 [id number]   Received provider portion of Lyrica application. Faxed completed application and required documents in to [employer].  Will follow up with company in 2-3 business days to check status of application.  Maud Deed [place], [place] Management [phone]

## 2018-06-22 ENCOUNTER — Ambulatory Visit: Payer: Self-pay | Admitting: Pharmacist

## 2018-06-22 ENCOUNTER — Other Ambulatory Visit: Payer: Self-pay | Admitting: Pharmacy Technician

## 2018-06-22 NOTE — Patient Outreach (Signed)
[facility]) Care Management  06/22/2018  Christopher Mclean 1946-08-15 [id number]   Follow up call to [place] patient assistance to check status of patients application for Lyrica. Representative confirmed that patient has been denied due to income being over program limit.   Will route note to [place] to inform.  Maud Deed [place], [place] Management [phone]

## 2018-06-26 DIAGNOSIS — H2513 Age-related nuclear cataract, bilateral: Secondary | ICD-10-CM | POA: Diagnosis not present

## 2018-06-27 ENCOUNTER — Ambulatory Visit: Payer: Self-pay | Admitting: Pharmacist

## 2018-06-29 ENCOUNTER — Other Ambulatory Visit: Payer: Self-pay | Admitting: Pharmacist

## 2018-06-29 NOTE — Patient Outreach (Signed)
[place] [facility]) Care Management  06/29/2018  Christopher Mclean [birth year] [id number]  Unsuccessful outreach to [name] to follow up regarding medication assistance for Lyrica.  HIPAA compliant message left encouraging call back.   Of note, Lyrica generic 75mg  BID cash price $28/month.  PLAN:  -I will follow up with patient next week to reach out and offer therapeutic alternatives.  Regina Eck, PharmD, [place]  [phone]

## 2018-07-02 ENCOUNTER — Other Ambulatory Visit: Payer: Self-pay | Admitting: Pharmacist

## 2018-07-02 NOTE — Patient Outreach (Signed)
[place] [facility]) Care Management  07/02/2018  SEVERUS BRODZINSKI 1945/12/17 [id number]  Incoming call from Mr. Braman with HIPAA identifiers verified.  [facility] pharmacist explained to patient that he was denied from the Lyrica PAP due to income.  Patient was disappointed, but verbalizes understanding and appreciates efforts.    [facility] Pharmacist offered to call [facility] to see if generic can be filled.  Patient states he has tried to get generic Lyrica filled, however HTA was charging him a higher copay for generic Lyrica vs. Brand name Lyrica.  Patient states he picked up his last Brand name RX Lyrica 1.5 wks ago.  It will be too early to try to fill generic Lyrica on cash.  Will attempt to fill generic Lyrica next month via cash payment.  Patient in agreement.  [facility] per [facility] record and patient report  PLAN: [facility] Pharmacist will f/u with patient in 2 weeks when generic Lyrica will be eligible for fill  Regina Eck, PharmD, [place]  [phone]

## 2018-07-04 ENCOUNTER — Ambulatory Visit: Payer: PPO | Admitting: Pharmacist

## 2018-07-16 ENCOUNTER — Other Ambulatory Visit: Payer: Self-pay | Admitting: Pharmacist

## 2018-07-16 ENCOUNTER — Ambulatory Visit: Payer: PPO | Admitting: Pharmacist

## 2018-07-16 NOTE — Patient Outreach (Addendum)
[place] [facility]) Care Management  07/16/2018  JOYCE LECKEY April 10, 1946 [id number]  Incoming call from Mr. Jewkes with HIPAA identifiers verified.  Patient was interested to see if his Lyrica could be refilled this week as the generic form.  He last had it filled on 06/25/18, therefore it is likely too early to fill.  Care coordination call to [facility] [phone]).  CPhT stated that Lyrica RX was written as a DAW 1, therefore new RX will need to be called in.  Price quoted for Lyrica generic 75mg  BID #60 is $46.  Care coordination call to Dr. Tor Netters CMA Janett Billow).  [facility] Pharmacist requested new Lyrica generic RX.  She stated she would route message to Dr. Unk Lightning to refill medication as requested.  Successful return outreach call to Mr. Berenson.  Informed patient of the above findings.  Encouraged patient to call [facility] on Friday to check on the status of the RX.  Emphasized to patient and wife that they should request Lyrica generic to be run through on cash price.  Patient verbalized understanding.   Aware of case closure, but callback information was left with patient should he need my services in the future.   PLAN: [facility] Pharmacist will close this case as goals have been achieved.  I'm happy to assist in the future as needed -Case closure letters routed   Regina Eck, PharmD, [place]  [phone]

## 2018-07-17 DIAGNOSIS — G901 Familial dysautonomia [Riley-Day]: Secondary | ICD-10-CM | POA: Diagnosis not present

## 2018-07-17 DIAGNOSIS — M5442 Lumbago with sciatica, left side: Secondary | ICD-10-CM | POA: Diagnosis not present

## 2018-07-17 DIAGNOSIS — M5441 Lumbago with sciatica, right side: Secondary | ICD-10-CM | POA: Diagnosis not present

## 2018-07-17 DIAGNOSIS — Z23 Encounter for immunization: Secondary | ICD-10-CM | POA: Diagnosis not present

## 2018-07-17 DIAGNOSIS — G603 Idiopathic progressive neuropathy: Secondary | ICD-10-CM | POA: Diagnosis not present

## 2018-07-18 DIAGNOSIS — I10 Essential (primary) hypertension: Secondary | ICD-10-CM | POA: Diagnosis not present

## 2018-07-19 ENCOUNTER — Ambulatory Visit: Payer: PPO | Admitting: Pharmacist

## 2018-07-27 DIAGNOSIS — M25511 Pain in right shoulder: Secondary | ICD-10-CM | POA: Diagnosis not present

## 2018-07-27 DIAGNOSIS — Z135 Encounter for screening for eye and ear disorders: Secondary | ICD-10-CM | POA: Diagnosis not present

## 2018-07-27 DIAGNOSIS — G8929 Other chronic pain: Secondary | ICD-10-CM | POA: Diagnosis not present

## 2018-07-27 DIAGNOSIS — N183 Chronic kidney disease, stage 3 (moderate): Secondary | ICD-10-CM | POA: Diagnosis not present

## 2018-07-28 DIAGNOSIS — M25511 Pain in right shoulder: Secondary | ICD-10-CM | POA: Diagnosis not present

## 2018-07-28 DIAGNOSIS — M19011 Primary osteoarthritis, right shoulder: Secondary | ICD-10-CM | POA: Diagnosis not present

## 2018-08-02 DIAGNOSIS — M7501 Adhesive capsulitis of right shoulder: Secondary | ICD-10-CM | POA: Diagnosis not present

## 2018-08-13 DIAGNOSIS — M7501 Adhesive capsulitis of right shoulder: Secondary | ICD-10-CM | POA: Diagnosis not present

## 2018-08-13 DIAGNOSIS — M7502 Adhesive capsulitis of left shoulder: Secondary | ICD-10-CM | POA: Diagnosis not present

## 2018-08-13 DIAGNOSIS — M25511 Pain in right shoulder: Secondary | ICD-10-CM | POA: Diagnosis not present

## 2018-08-13 DIAGNOSIS — M25512 Pain in left shoulder: Secondary | ICD-10-CM | POA: Diagnosis not present

## 2018-08-13 DIAGNOSIS — M79601 Pain in right arm: Secondary | ICD-10-CM | POA: Diagnosis not present

## 2018-08-15 DIAGNOSIS — G901 Familial dysautonomia [Riley-Day]: Secondary | ICD-10-CM | POA: Diagnosis not present

## 2018-08-15 DIAGNOSIS — G609 Hereditary and idiopathic neuropathy, unspecified: Secondary | ICD-10-CM | POA: Diagnosis not present

## 2018-08-16 DIAGNOSIS — M79601 Pain in right arm: Secondary | ICD-10-CM | POA: Diagnosis not present

## 2018-08-16 DIAGNOSIS — M25511 Pain in right shoulder: Secondary | ICD-10-CM | POA: Diagnosis not present

## 2018-08-16 DIAGNOSIS — M25512 Pain in left shoulder: Secondary | ICD-10-CM | POA: Diagnosis not present

## 2018-08-16 DIAGNOSIS — M7501 Adhesive capsulitis of right shoulder: Secondary | ICD-10-CM | POA: Diagnosis not present

## 2018-08-16 DIAGNOSIS — M7502 Adhesive capsulitis of left shoulder: Secondary | ICD-10-CM | POA: Diagnosis not present

## 2018-08-20 DIAGNOSIS — M7502 Adhesive capsulitis of left shoulder: Secondary | ICD-10-CM | POA: Diagnosis not present

## 2018-08-20 DIAGNOSIS — M25511 Pain in right shoulder: Secondary | ICD-10-CM | POA: Diagnosis not present

## 2018-08-20 DIAGNOSIS — M79601 Pain in right arm: Secondary | ICD-10-CM | POA: Diagnosis not present

## 2018-08-20 DIAGNOSIS — M7501 Adhesive capsulitis of right shoulder: Secondary | ICD-10-CM | POA: Diagnosis not present

## 2018-08-20 DIAGNOSIS — M25512 Pain in left shoulder: Secondary | ICD-10-CM | POA: Diagnosis not present

## 2018-08-23 DIAGNOSIS — M7501 Adhesive capsulitis of right shoulder: Secondary | ICD-10-CM | POA: Diagnosis not present

## 2018-08-23 DIAGNOSIS — M7502 Adhesive capsulitis of left shoulder: Secondary | ICD-10-CM | POA: Diagnosis not present

## 2018-08-23 DIAGNOSIS — M79601 Pain in right arm: Secondary | ICD-10-CM | POA: Diagnosis not present

## 2018-08-23 DIAGNOSIS — M25511 Pain in right shoulder: Secondary | ICD-10-CM | POA: Diagnosis not present

## 2018-08-23 DIAGNOSIS — M25512 Pain in left shoulder: Secondary | ICD-10-CM | POA: Diagnosis not present

## 2018-08-27 DIAGNOSIS — M25512 Pain in left shoulder: Secondary | ICD-10-CM | POA: Diagnosis not present

## 2018-08-27 DIAGNOSIS — M79601 Pain in right arm: Secondary | ICD-10-CM | POA: Diagnosis not present

## 2018-08-27 DIAGNOSIS — M7502 Adhesive capsulitis of left shoulder: Secondary | ICD-10-CM | POA: Diagnosis not present

## 2018-08-27 DIAGNOSIS — M25511 Pain in right shoulder: Secondary | ICD-10-CM | POA: Diagnosis not present

## 2018-08-27 DIAGNOSIS — M7501 Adhesive capsulitis of right shoulder: Secondary | ICD-10-CM | POA: Diagnosis not present

## 2018-09-03 DIAGNOSIS — M25511 Pain in right shoulder: Secondary | ICD-10-CM | POA: Diagnosis not present

## 2018-09-03 DIAGNOSIS — M79601 Pain in right arm: Secondary | ICD-10-CM | POA: Diagnosis not present

## 2018-09-03 DIAGNOSIS — M25512 Pain in left shoulder: Secondary | ICD-10-CM | POA: Diagnosis not present

## 2018-09-03 DIAGNOSIS — M7502 Adhesive capsulitis of left shoulder: Secondary | ICD-10-CM | POA: Diagnosis not present

## 2018-09-03 DIAGNOSIS — M7501 Adhesive capsulitis of right shoulder: Secondary | ICD-10-CM | POA: Diagnosis not present

## 2018-09-05 DIAGNOSIS — M5416 Radiculopathy, lumbar region: Secondary | ICD-10-CM | POA: Diagnosis not present

## 2018-09-05 DIAGNOSIS — M792 Neuralgia and neuritis, unspecified: Secondary | ICD-10-CM | POA: Diagnosis not present

## 2018-09-05 DIAGNOSIS — M25561 Pain in right knee: Secondary | ICD-10-CM | POA: Diagnosis not present

## 2018-09-05 DIAGNOSIS — M170 Bilateral primary osteoarthritis of knee: Secondary | ICD-10-CM | POA: Diagnosis not present

## 2018-09-05 DIAGNOSIS — G8929 Other chronic pain: Secondary | ICD-10-CM | POA: Diagnosis not present

## 2018-09-05 DIAGNOSIS — M1712 Unilateral primary osteoarthritis, left knee: Secondary | ICD-10-CM | POA: Diagnosis not present

## 2018-09-05 DIAGNOSIS — T887XXA Unspecified adverse effect of drug or medicament, initial encounter: Secondary | ICD-10-CM | POA: Diagnosis not present

## 2018-09-05 DIAGNOSIS — M1711 Unilateral primary osteoarthritis, right knee: Secondary | ICD-10-CM | POA: Diagnosis not present

## 2018-09-05 DIAGNOSIS — M25562 Pain in left knee: Secondary | ICD-10-CM | POA: Diagnosis not present

## 2018-09-05 DIAGNOSIS — G609 Hereditary and idiopathic neuropathy, unspecified: Secondary | ICD-10-CM | POA: Diagnosis not present

## 2018-09-10 DIAGNOSIS — M7501 Adhesive capsulitis of right shoulder: Secondary | ICD-10-CM | POA: Diagnosis not present

## 2018-09-10 DIAGNOSIS — M25511 Pain in right shoulder: Secondary | ICD-10-CM | POA: Diagnosis not present

## 2018-09-10 DIAGNOSIS — M7502 Adhesive capsulitis of left shoulder: Secondary | ICD-10-CM | POA: Diagnosis not present

## 2018-09-10 DIAGNOSIS — M79601 Pain in right arm: Secondary | ICD-10-CM | POA: Diagnosis not present

## 2018-09-10 DIAGNOSIS — M25512 Pain in left shoulder: Secondary | ICD-10-CM | POA: Diagnosis not present

## 2018-09-11 DIAGNOSIS — Z7982 Long term (current) use of aspirin: Secondary | ICD-10-CM

## 2018-09-11 DIAGNOSIS — I951 Orthostatic hypotension: Secondary | ICD-10-CM

## 2018-09-11 HISTORY — DX: Orthostatic hypotension: I95.1

## 2018-09-11 HISTORY — DX: Long term (current) use of aspirin: Z79.82

## 2018-09-12 DIAGNOSIS — M48061 Spinal stenosis, lumbar region without neurogenic claudication: Secondary | ICD-10-CM | POA: Diagnosis not present

## 2018-09-12 DIAGNOSIS — M5126 Other intervertebral disc displacement, lumbar region: Secondary | ICD-10-CM | POA: Diagnosis not present

## 2018-09-12 DIAGNOSIS — M5416 Radiculopathy, lumbar region: Secondary | ICD-10-CM | POA: Diagnosis not present

## 2018-09-12 DIAGNOSIS — M47816 Spondylosis without myelopathy or radiculopathy, lumbar region: Secondary | ICD-10-CM | POA: Diagnosis not present

## 2018-09-13 DIAGNOSIS — Z8679 Personal history of other diseases of the circulatory system: Secondary | ICD-10-CM | POA: Diagnosis not present

## 2018-09-13 DIAGNOSIS — G609 Hereditary and idiopathic neuropathy, unspecified: Secondary | ICD-10-CM

## 2018-09-13 DIAGNOSIS — M25512 Pain in left shoulder: Secondary | ICD-10-CM | POA: Diagnosis not present

## 2018-09-13 DIAGNOSIS — M25511 Pain in right shoulder: Secondary | ICD-10-CM | POA: Diagnosis not present

## 2018-09-13 DIAGNOSIS — Z7982 Long term (current) use of aspirin: Secondary | ICD-10-CM | POA: Diagnosis not present

## 2018-09-13 DIAGNOSIS — M7502 Adhesive capsulitis of left shoulder: Secondary | ICD-10-CM | POA: Diagnosis not present

## 2018-09-13 DIAGNOSIS — I10 Essential (primary) hypertension: Secondary | ICD-10-CM | POA: Diagnosis not present

## 2018-09-13 DIAGNOSIS — I471 Supraventricular tachycardia: Secondary | ICD-10-CM | POA: Diagnosis not present

## 2018-09-13 DIAGNOSIS — M7501 Adhesive capsulitis of right shoulder: Secondary | ICD-10-CM | POA: Diagnosis not present

## 2018-09-13 DIAGNOSIS — M79601 Pain in right arm: Secondary | ICD-10-CM | POA: Diagnosis not present

## 2018-09-13 DIAGNOSIS — E782 Mixed hyperlipidemia: Secondary | ICD-10-CM | POA: Diagnosis not present

## 2018-09-13 DIAGNOSIS — Z9889 Other specified postprocedural states: Secondary | ICD-10-CM | POA: Diagnosis not present

## 2018-09-13 DIAGNOSIS — I951 Orthostatic hypotension: Secondary | ICD-10-CM | POA: Diagnosis not present

## 2018-09-13 HISTORY — DX: Hereditary and idiopathic neuropathy, unspecified: G60.9

## 2018-09-14 DIAGNOSIS — Z8679 Personal history of other diseases of the circulatory system: Secondary | ICD-10-CM | POA: Diagnosis not present

## 2018-09-14 DIAGNOSIS — I471 Supraventricular tachycardia: Secondary | ICD-10-CM | POA: Diagnosis not present

## 2018-09-14 DIAGNOSIS — G609 Hereditary and idiopathic neuropathy, unspecified: Secondary | ICD-10-CM | POA: Diagnosis not present

## 2018-09-14 DIAGNOSIS — Z9889 Other specified postprocedural states: Secondary | ICD-10-CM | POA: Diagnosis not present

## 2018-09-14 DIAGNOSIS — Z7982 Long term (current) use of aspirin: Secondary | ICD-10-CM | POA: Diagnosis not present

## 2018-09-14 DIAGNOSIS — E782 Mixed hyperlipidemia: Secondary | ICD-10-CM | POA: Diagnosis not present

## 2018-09-14 DIAGNOSIS — I10 Essential (primary) hypertension: Secondary | ICD-10-CM | POA: Diagnosis not present

## 2018-09-14 DIAGNOSIS — I951 Orthostatic hypotension: Secondary | ICD-10-CM | POA: Diagnosis not present

## 2018-09-18 DIAGNOSIS — M7501 Adhesive capsulitis of right shoulder: Secondary | ICD-10-CM | POA: Diagnosis not present

## 2018-09-18 DIAGNOSIS — M25512 Pain in left shoulder: Secondary | ICD-10-CM | POA: Diagnosis not present

## 2018-09-18 DIAGNOSIS — M79601 Pain in right arm: Secondary | ICD-10-CM | POA: Diagnosis not present

## 2018-09-18 DIAGNOSIS — M25511 Pain in right shoulder: Secondary | ICD-10-CM | POA: Diagnosis not present

## 2018-09-18 DIAGNOSIS — M7502 Adhesive capsulitis of left shoulder: Secondary | ICD-10-CM | POA: Diagnosis not present

## 2018-09-19 DIAGNOSIS — Z9889 Other specified postprocedural states: Secondary | ICD-10-CM | POA: Diagnosis not present

## 2018-09-19 DIAGNOSIS — Z7982 Long term (current) use of aspirin: Secondary | ICD-10-CM | POA: Diagnosis not present

## 2018-09-19 DIAGNOSIS — Z8679 Personal history of other diseases of the circulatory system: Secondary | ICD-10-CM | POA: Diagnosis not present

## 2018-09-19 DIAGNOSIS — E782 Mixed hyperlipidemia: Secondary | ICD-10-CM | POA: Diagnosis not present

## 2018-09-19 DIAGNOSIS — I471 Supraventricular tachycardia: Secondary | ICD-10-CM | POA: Diagnosis not present

## 2018-09-19 DIAGNOSIS — I951 Orthostatic hypotension: Secondary | ICD-10-CM | POA: Diagnosis not present

## 2018-09-19 DIAGNOSIS — I10 Essential (primary) hypertension: Secondary | ICD-10-CM | POA: Diagnosis not present

## 2018-09-19 DIAGNOSIS — G609 Hereditary and idiopathic neuropathy, unspecified: Secondary | ICD-10-CM | POA: Diagnosis not present

## 2018-09-20 DIAGNOSIS — M25511 Pain in right shoulder: Secondary | ICD-10-CM | POA: Diagnosis not present

## 2018-09-20 DIAGNOSIS — M7501 Adhesive capsulitis of right shoulder: Secondary | ICD-10-CM | POA: Diagnosis not present

## 2018-09-20 DIAGNOSIS — M25512 Pain in left shoulder: Secondary | ICD-10-CM | POA: Diagnosis not present

## 2018-09-20 DIAGNOSIS — M79601 Pain in right arm: Secondary | ICD-10-CM | POA: Diagnosis not present

## 2018-09-20 DIAGNOSIS — M7502 Adhesive capsulitis of left shoulder: Secondary | ICD-10-CM | POA: Diagnosis not present

## 2018-09-24 DIAGNOSIS — M25512 Pain in left shoulder: Secondary | ICD-10-CM | POA: Diagnosis not present

## 2018-09-24 DIAGNOSIS — M7501 Adhesive capsulitis of right shoulder: Secondary | ICD-10-CM | POA: Diagnosis not present

## 2018-09-24 DIAGNOSIS — M79601 Pain in right arm: Secondary | ICD-10-CM | POA: Diagnosis not present

## 2018-09-24 DIAGNOSIS — M25511 Pain in right shoulder: Secondary | ICD-10-CM | POA: Diagnosis not present

## 2018-09-24 DIAGNOSIS — M7502 Adhesive capsulitis of left shoulder: Secondary | ICD-10-CM | POA: Diagnosis not present

## 2018-09-27 DIAGNOSIS — M25511 Pain in right shoulder: Secondary | ICD-10-CM | POA: Diagnosis not present

## 2018-09-27 DIAGNOSIS — M79601 Pain in right arm: Secondary | ICD-10-CM | POA: Diagnosis not present

## 2018-09-27 DIAGNOSIS — M7501 Adhesive capsulitis of right shoulder: Secondary | ICD-10-CM | POA: Diagnosis not present

## 2018-09-27 DIAGNOSIS — M7502 Adhesive capsulitis of left shoulder: Secondary | ICD-10-CM | POA: Diagnosis not present

## 2018-09-27 DIAGNOSIS — M25512 Pain in left shoulder: Secondary | ICD-10-CM | POA: Diagnosis not present

## 2018-10-04 DIAGNOSIS — M5416 Radiculopathy, lumbar region: Secondary | ICD-10-CM | POA: Diagnosis not present

## 2018-10-04 DIAGNOSIS — G609 Hereditary and idiopathic neuropathy, unspecified: Secondary | ICD-10-CM | POA: Diagnosis not present

## 2018-10-04 DIAGNOSIS — G8929 Other chronic pain: Secondary | ICD-10-CM | POA: Diagnosis not present

## 2018-10-04 DIAGNOSIS — M4126 Other idiopathic scoliosis, lumbar region: Secondary | ICD-10-CM | POA: Diagnosis not present

## 2018-10-04 DIAGNOSIS — M25561 Pain in right knee: Secondary | ICD-10-CM | POA: Diagnosis not present

## 2018-10-04 DIAGNOSIS — M5440 Lumbago with sciatica, unspecified side: Secondary | ICD-10-CM | POA: Diagnosis not present

## 2018-10-04 DIAGNOSIS — M792 Neuralgia and neuritis, unspecified: Secondary | ICD-10-CM | POA: Diagnosis not present

## 2018-10-04 DIAGNOSIS — M5136 Other intervertebral disc degeneration, lumbar region: Secondary | ICD-10-CM | POA: Diagnosis not present

## 2018-10-04 DIAGNOSIS — M25562 Pain in left knee: Secondary | ICD-10-CM | POA: Diagnosis not present

## 2018-10-04 DIAGNOSIS — M4316 Spondylolisthesis, lumbar region: Secondary | ICD-10-CM | POA: Diagnosis not present

## 2018-10-04 DIAGNOSIS — M5126 Other intervertebral disc displacement, lumbar region: Secondary | ICD-10-CM | POA: Diagnosis not present

## 2018-10-11 DIAGNOSIS — G609 Hereditary and idiopathic neuropathy, unspecified: Secondary | ICD-10-CM | POA: Diagnosis not present

## 2018-10-11 DIAGNOSIS — M25512 Pain in left shoulder: Secondary | ICD-10-CM | POA: Diagnosis not present

## 2018-10-11 DIAGNOSIS — I10 Essential (primary) hypertension: Secondary | ICD-10-CM | POA: Diagnosis not present

## 2018-10-11 DIAGNOSIS — M25511 Pain in right shoulder: Secondary | ICD-10-CM | POA: Diagnosis not present

## 2018-10-11 DIAGNOSIS — M79601 Pain in right arm: Secondary | ICD-10-CM | POA: Diagnosis not present

## 2018-10-11 DIAGNOSIS — I471 Supraventricular tachycardia: Secondary | ICD-10-CM | POA: Diagnosis not present

## 2018-10-11 DIAGNOSIS — M7502 Adhesive capsulitis of left shoulder: Secondary | ICD-10-CM | POA: Diagnosis not present

## 2018-10-11 DIAGNOSIS — Z8679 Personal history of other diseases of the circulatory system: Secondary | ICD-10-CM | POA: Diagnosis not present

## 2018-10-11 DIAGNOSIS — Z9889 Other specified postprocedural states: Secondary | ICD-10-CM | POA: Diagnosis not present

## 2018-10-11 DIAGNOSIS — E782 Mixed hyperlipidemia: Secondary | ICD-10-CM | POA: Diagnosis not present

## 2018-10-11 DIAGNOSIS — I951 Orthostatic hypotension: Secondary | ICD-10-CM | POA: Diagnosis not present

## 2018-10-11 DIAGNOSIS — M7501 Adhesive capsulitis of right shoulder: Secondary | ICD-10-CM | POA: Diagnosis not present

## 2018-10-11 DIAGNOSIS — Z7982 Long term (current) use of aspirin: Secondary | ICD-10-CM | POA: Diagnosis not present

## 2018-10-17 DIAGNOSIS — I517 Cardiomegaly: Secondary | ICD-10-CM | POA: Diagnosis not present

## 2018-10-17 DIAGNOSIS — J9809 Other diseases of bronchus, not elsewhere classified: Secondary | ICD-10-CM | POA: Diagnosis not present

## 2018-10-17 DIAGNOSIS — Q2549 Other congenital malformations of aorta: Secondary | ICD-10-CM | POA: Diagnosis not present

## 2018-10-17 DIAGNOSIS — I2510 Atherosclerotic heart disease of native coronary artery without angina pectoris: Secondary | ICD-10-CM | POA: Diagnosis not present

## 2018-10-17 DIAGNOSIS — I359 Nonrheumatic aortic valve disorder, unspecified: Secondary | ICD-10-CM | POA: Diagnosis not present

## 2018-11-01 DIAGNOSIS — M7502 Adhesive capsulitis of left shoulder: Secondary | ICD-10-CM | POA: Diagnosis not present

## 2018-11-01 DIAGNOSIS — M25511 Pain in right shoulder: Secondary | ICD-10-CM | POA: Diagnosis not present

## 2018-11-01 DIAGNOSIS — M7501 Adhesive capsulitis of right shoulder: Secondary | ICD-10-CM | POA: Diagnosis not present

## 2018-11-01 DIAGNOSIS — M25512 Pain in left shoulder: Secondary | ICD-10-CM | POA: Diagnosis not present

## 2018-11-01 DIAGNOSIS — M79601 Pain in right arm: Secondary | ICD-10-CM | POA: Diagnosis not present

## 2018-11-06 DIAGNOSIS — M25561 Pain in right knee: Secondary | ICD-10-CM | POA: Diagnosis not present

## 2018-11-06 DIAGNOSIS — M25562 Pain in left knee: Secondary | ICD-10-CM | POA: Diagnosis not present

## 2018-11-06 DIAGNOSIS — M47816 Spondylosis without myelopathy or radiculopathy, lumbar region: Secondary | ICD-10-CM | POA: Diagnosis not present

## 2018-11-06 DIAGNOSIS — G609 Hereditary and idiopathic neuropathy, unspecified: Secondary | ICD-10-CM | POA: Diagnosis not present

## 2018-11-06 DIAGNOSIS — G8929 Other chronic pain: Secondary | ICD-10-CM | POA: Diagnosis not present

## 2018-11-06 DIAGNOSIS — M792 Neuralgia and neuritis, unspecified: Secondary | ICD-10-CM | POA: Diagnosis not present

## 2018-11-13 DIAGNOSIS — I10 Essential (primary) hypertension: Secondary | ICD-10-CM | POA: Diagnosis not present

## 2018-11-13 DIAGNOSIS — Z8679 Personal history of other diseases of the circulatory system: Secondary | ICD-10-CM | POA: Diagnosis not present

## 2018-11-13 DIAGNOSIS — Z9889 Other specified postprocedural states: Secondary | ICD-10-CM | POA: Diagnosis not present

## 2018-11-13 DIAGNOSIS — E782 Mixed hyperlipidemia: Secondary | ICD-10-CM | POA: Diagnosis not present

## 2018-11-13 DIAGNOSIS — Z7982 Long term (current) use of aspirin: Secondary | ICD-10-CM | POA: Diagnosis not present

## 2018-11-13 DIAGNOSIS — I471 Supraventricular tachycardia: Secondary | ICD-10-CM | POA: Diagnosis not present

## 2018-11-13 DIAGNOSIS — G609 Hereditary and idiopathic neuropathy, unspecified: Secondary | ICD-10-CM | POA: Diagnosis not present

## 2018-11-13 DIAGNOSIS — I951 Orthostatic hypotension: Secondary | ICD-10-CM | POA: Diagnosis not present

## 2018-11-22 DIAGNOSIS — H25013 Cortical age-related cataract, bilateral: Secondary | ICD-10-CM | POA: Diagnosis not present

## 2018-11-22 DIAGNOSIS — H02831 Dermatochalasis of right upper eyelid: Secondary | ICD-10-CM | POA: Diagnosis not present

## 2018-11-22 DIAGNOSIS — H18413 Arcus senilis, bilateral: Secondary | ICD-10-CM | POA: Diagnosis not present

## 2018-11-22 DIAGNOSIS — H2511 Age-related nuclear cataract, right eye: Secondary | ICD-10-CM | POA: Diagnosis not present

## 2018-11-22 DIAGNOSIS — H2513 Age-related nuclear cataract, bilateral: Secondary | ICD-10-CM | POA: Diagnosis not present

## 2018-11-22 DIAGNOSIS — H25043 Posterior subcapsular polar age-related cataract, bilateral: Secondary | ICD-10-CM | POA: Diagnosis not present

## 2018-11-27 DIAGNOSIS — M25511 Pain in right shoulder: Secondary | ICD-10-CM | POA: Diagnosis not present

## 2018-11-27 DIAGNOSIS — G8929 Other chronic pain: Secondary | ICD-10-CM | POA: Diagnosis not present

## 2018-12-11 DIAGNOSIS — I10 Essential (primary) hypertension: Secondary | ICD-10-CM | POA: Diagnosis not present

## 2018-12-11 DIAGNOSIS — I951 Orthostatic hypotension: Secondary | ICD-10-CM | POA: Diagnosis not present

## 2018-12-11 DIAGNOSIS — G609 Hereditary and idiopathic neuropathy, unspecified: Secondary | ICD-10-CM | POA: Diagnosis not present

## 2019-01-08 DIAGNOSIS — M792 Neuralgia and neuritis, unspecified: Secondary | ICD-10-CM | POA: Diagnosis not present

## 2019-01-08 DIAGNOSIS — M47816 Spondylosis without myelopathy or radiculopathy, lumbar region: Secondary | ICD-10-CM | POA: Diagnosis not present

## 2019-01-08 DIAGNOSIS — G894 Chronic pain syndrome: Secondary | ICD-10-CM | POA: Diagnosis not present

## 2019-01-08 DIAGNOSIS — G629 Polyneuropathy, unspecified: Secondary | ICD-10-CM | POA: Diagnosis not present

## 2019-01-22 DIAGNOSIS — I951 Orthostatic hypotension: Secondary | ICD-10-CM | POA: Diagnosis not present

## 2019-01-22 DIAGNOSIS — G609 Hereditary and idiopathic neuropathy, unspecified: Secondary | ICD-10-CM | POA: Diagnosis not present

## 2019-01-22 DIAGNOSIS — I10 Essential (primary) hypertension: Secondary | ICD-10-CM | POA: Diagnosis not present

## 2019-02-01 DIAGNOSIS — R972 Elevated prostate specific antigen [PSA]: Secondary | ICD-10-CM | POA: Diagnosis not present

## 2019-02-01 DIAGNOSIS — R93421 Abnormal radiologic findings on diagnostic imaging of right kidney: Secondary | ICD-10-CM | POA: Diagnosis not present

## 2019-02-01 DIAGNOSIS — N401 Enlarged prostate with lower urinary tract symptoms: Secondary | ICD-10-CM | POA: Diagnosis not present

## 2019-02-01 DIAGNOSIS — N200 Calculus of kidney: Secondary | ICD-10-CM | POA: Diagnosis not present

## 2019-02-01 DIAGNOSIS — N2889 Other specified disorders of kidney and ureter: Secondary | ICD-10-CM | POA: Diagnosis not present

## 2019-02-01 DIAGNOSIS — R351 Nocturia: Secondary | ICD-10-CM | POA: Diagnosis not present

## 2019-02-01 DIAGNOSIS — N183 Chronic kidney disease, stage 3 (moderate): Secondary | ICD-10-CM | POA: Diagnosis not present

## 2019-02-19 DIAGNOSIS — G629 Polyneuropathy, unspecified: Secondary | ICD-10-CM | POA: Diagnosis not present

## 2019-02-19 DIAGNOSIS — M47816 Spondylosis without myelopathy or radiculopathy, lumbar region: Secondary | ICD-10-CM | POA: Diagnosis not present

## 2019-02-19 DIAGNOSIS — G894 Chronic pain syndrome: Secondary | ICD-10-CM | POA: Diagnosis not present

## 2019-02-19 DIAGNOSIS — M792 Neuralgia and neuritis, unspecified: Secondary | ICD-10-CM | POA: Diagnosis not present

## 2019-02-20 DIAGNOSIS — M47816 Spondylosis without myelopathy or radiculopathy, lumbar region: Secondary | ICD-10-CM | POA: Insufficient documentation

## 2019-02-20 HISTORY — DX: Spondylosis without myelopathy or radiculopathy, lumbar region: M47.816

## 2019-02-25 DIAGNOSIS — M47816 Spondylosis without myelopathy or radiculopathy, lumbar region: Secondary | ICD-10-CM | POA: Diagnosis not present

## 2019-03-06 DIAGNOSIS — M47816 Spondylosis without myelopathy or radiculopathy, lumbar region: Secondary | ICD-10-CM | POA: Diagnosis not present

## 2019-03-11 DIAGNOSIS — H2513 Age-related nuclear cataract, bilateral: Secondary | ICD-10-CM | POA: Diagnosis not present

## 2019-03-11 DIAGNOSIS — H2511 Age-related nuclear cataract, right eye: Secondary | ICD-10-CM | POA: Diagnosis not present

## 2019-03-12 DIAGNOSIS — H25012 Cortical age-related cataract, left eye: Secondary | ICD-10-CM | POA: Diagnosis not present

## 2019-03-12 DIAGNOSIS — H2512 Age-related nuclear cataract, left eye: Secondary | ICD-10-CM | POA: Diagnosis not present

## 2019-03-12 DIAGNOSIS — H25042 Posterior subcapsular polar age-related cataract, left eye: Secondary | ICD-10-CM | POA: Diagnosis not present

## 2019-03-20 DIAGNOSIS — M47816 Spondylosis without myelopathy or radiculopathy, lumbar region: Secondary | ICD-10-CM | POA: Diagnosis not present

## 2019-03-25 DIAGNOSIS — I951 Orthostatic hypotension: Secondary | ICD-10-CM | POA: Diagnosis not present

## 2019-03-25 DIAGNOSIS — I10 Essential (primary) hypertension: Secondary | ICD-10-CM | POA: Diagnosis not present

## 2019-03-25 DIAGNOSIS — G609 Hereditary and idiopathic neuropathy, unspecified: Secondary | ICD-10-CM | POA: Diagnosis not present

## 2019-04-01 DIAGNOSIS — H2512 Age-related nuclear cataract, left eye: Secondary | ICD-10-CM | POA: Diagnosis not present

## 2019-04-03 DIAGNOSIS — M47817 Spondylosis without myelopathy or radiculopathy, lumbosacral region: Secondary | ICD-10-CM | POA: Diagnosis not present

## 2019-04-30 DIAGNOSIS — G629 Polyneuropathy, unspecified: Secondary | ICD-10-CM | POA: Diagnosis not present

## 2019-04-30 DIAGNOSIS — G894 Chronic pain syndrome: Secondary | ICD-10-CM | POA: Diagnosis not present

## 2019-04-30 DIAGNOSIS — M47816 Spondylosis without myelopathy or radiculopathy, lumbar region: Secondary | ICD-10-CM | POA: Diagnosis not present

## 2019-05-08 DIAGNOSIS — E782 Mixed hyperlipidemia: Secondary | ICD-10-CM | POA: Diagnosis not present

## 2019-05-08 DIAGNOSIS — N183 Chronic kidney disease, stage 3 (moderate): Secondary | ICD-10-CM | POA: Diagnosis not present

## 2019-05-13 DIAGNOSIS — I951 Orthostatic hypotension: Secondary | ICD-10-CM | POA: Diagnosis not present

## 2019-05-13 DIAGNOSIS — I10 Essential (primary) hypertension: Secondary | ICD-10-CM | POA: Diagnosis not present

## 2019-05-13 DIAGNOSIS — G609 Hereditary and idiopathic neuropathy, unspecified: Secondary | ICD-10-CM | POA: Diagnosis not present

## 2019-05-14 DIAGNOSIS — J302 Other seasonal allergic rhinitis: Secondary | ICD-10-CM | POA: Diagnosis not present

## 2019-05-14 DIAGNOSIS — N401 Enlarged prostate with lower urinary tract symptoms: Secondary | ICD-10-CM | POA: Diagnosis not present

## 2019-05-14 DIAGNOSIS — N183 Chronic kidney disease, stage 3 (moderate): Secondary | ICD-10-CM | POA: Diagnosis not present

## 2019-05-14 DIAGNOSIS — E782 Mixed hyperlipidemia: Secondary | ICD-10-CM | POA: Diagnosis not present

## 2019-05-14 DIAGNOSIS — I129 Hypertensive chronic kidney disease with stage 1 through stage 4 chronic kidney disease, or unspecified chronic kidney disease: Secondary | ICD-10-CM | POA: Diagnosis not present

## 2019-05-14 DIAGNOSIS — I951 Orthostatic hypotension: Secondary | ICD-10-CM | POA: Diagnosis not present

## 2019-05-14 DIAGNOSIS — K219 Gastro-esophageal reflux disease without esophagitis: Secondary | ICD-10-CM | POA: Diagnosis not present

## 2019-05-14 DIAGNOSIS — Z0000 Encounter for general adult medical examination without abnormal findings: Secondary | ICD-10-CM | POA: Diagnosis not present

## 2019-05-28 DIAGNOSIS — G629 Polyneuropathy, unspecified: Secondary | ICD-10-CM | POA: Diagnosis not present

## 2019-05-28 DIAGNOSIS — G894 Chronic pain syndrome: Secondary | ICD-10-CM | POA: Diagnosis not present

## 2019-05-28 DIAGNOSIS — M47816 Spondylosis without myelopathy or radiculopathy, lumbar region: Secondary | ICD-10-CM | POA: Diagnosis not present

## 2019-06-18 DIAGNOSIS — E782 Mixed hyperlipidemia: Secondary | ICD-10-CM

## 2019-06-18 HISTORY — DX: Mixed hyperlipidemia: E78.2

## 2019-06-20 DIAGNOSIS — I951 Orthostatic hypotension: Secondary | ICD-10-CM | POA: Diagnosis not present

## 2019-06-20 DIAGNOSIS — R55 Syncope and collapse: Secondary | ICD-10-CM | POA: Diagnosis not present

## 2019-06-20 DIAGNOSIS — I471 Supraventricular tachycardia: Secondary | ICD-10-CM | POA: Diagnosis not present

## 2019-06-20 DIAGNOSIS — I10 Essential (primary) hypertension: Secondary | ICD-10-CM | POA: Diagnosis not present

## 2019-07-13 DIAGNOSIS — Z23 Encounter for immunization: Secondary | ICD-10-CM | POA: Diagnosis not present

## 2019-08-07 DIAGNOSIS — H43812 Vitreous degeneration, left eye: Secondary | ICD-10-CM | POA: Diagnosis not present

## 2019-08-08 DIAGNOSIS — I951 Orthostatic hypotension: Secondary | ICD-10-CM | POA: Diagnosis not present

## 2019-08-08 DIAGNOSIS — R55 Syncope and collapse: Secondary | ICD-10-CM | POA: Diagnosis not present

## 2019-08-23 ENCOUNTER — Encounter: Payer: Self-pay | Admitting: Gastroenterology

## 2019-09-10 DIAGNOSIS — H43812 Vitreous degeneration, left eye: Secondary | ICD-10-CM | POA: Diagnosis not present

## 2019-09-23 ENCOUNTER — Other Ambulatory Visit: Payer: Self-pay | Admitting: *Deleted

## 2019-09-23 NOTE — Patient Outreach (Signed)
[facility]) Care Management  09/23/2019  KERN AHO 09/28/1946 [id number]   Case reviewed, no patient outreach needed,  and case closed per Bary Castilla, Assistant Clinical Director at [place]  request.   Colbert Coyer. Annia Friendly, BSN, [place] Management [facility] Telephonic CM Phone: [phone] Fax: [phone]

## 2019-09-25 DIAGNOSIS — I951 Orthostatic hypotension: Secondary | ICD-10-CM | POA: Diagnosis not present

## 2019-09-25 DIAGNOSIS — N401 Enlarged prostate with lower urinary tract symptoms: Secondary | ICD-10-CM | POA: Diagnosis not present

## 2019-09-25 DIAGNOSIS — R3912 Poor urinary stream: Secondary | ICD-10-CM | POA: Diagnosis not present

## 2019-09-25 DIAGNOSIS — Z5181 Encounter for therapeutic drug level monitoring: Secondary | ICD-10-CM | POA: Diagnosis not present

## 2019-09-25 DIAGNOSIS — G609 Hereditary and idiopathic neuropathy, unspecified: Secondary | ICD-10-CM | POA: Diagnosis not present

## 2019-10-24 DIAGNOSIS — R809 Proteinuria, unspecified: Secondary | ICD-10-CM | POA: Diagnosis not present

## 2019-10-24 DIAGNOSIS — I471 Supraventricular tachycardia: Secondary | ICD-10-CM | POA: Diagnosis not present

## 2019-10-24 DIAGNOSIS — R55 Syncope and collapse: Secondary | ICD-10-CM | POA: Diagnosis not present

## 2019-10-24 DIAGNOSIS — R61 Generalized hyperhidrosis: Secondary | ICD-10-CM | POA: Diagnosis not present

## 2019-11-13 DIAGNOSIS — U071 COVID-19: Secondary | ICD-10-CM | POA: Diagnosis not present

## 2019-11-13 DIAGNOSIS — J0140 Acute pansinusitis, unspecified: Secondary | ICD-10-CM | POA: Diagnosis not present

## 2019-11-13 DIAGNOSIS — R05 Cough: Secondary | ICD-10-CM | POA: Diagnosis not present

## 2019-11-14 DIAGNOSIS — I951 Orthostatic hypotension: Secondary | ICD-10-CM | POA: Diagnosis not present

## 2019-11-14 DIAGNOSIS — I10 Essential (primary) hypertension: Secondary | ICD-10-CM | POA: Diagnosis not present

## 2019-11-14 DIAGNOSIS — Z20822 Contact with and (suspected) exposure to covid-19: Secondary | ICD-10-CM

## 2019-11-14 DIAGNOSIS — J0140 Acute pansinusitis, unspecified: Secondary | ICD-10-CM

## 2019-11-14 DIAGNOSIS — G609 Hereditary and idiopathic neuropathy, unspecified: Secondary | ICD-10-CM | POA: Diagnosis not present

## 2019-11-14 HISTORY — DX: Contact with and (suspected) exposure to covid-19: Z20.822

## 2019-11-14 HISTORY — DX: Acute pansinusitis, unspecified: J01.40

## 2019-11-26 DIAGNOSIS — U071 COVID-19: Secondary | ICD-10-CM | POA: Diagnosis not present

## 2019-11-26 DIAGNOSIS — R0602 Shortness of breath: Secondary | ICD-10-CM | POA: Diagnosis not present

## 2019-11-26 DIAGNOSIS — J1282 Pneumonia due to coronavirus disease 2019: Secondary | ICD-10-CM | POA: Diagnosis not present

## 2019-11-26 DIAGNOSIS — R05 Cough: Secondary | ICD-10-CM | POA: Diagnosis not present

## 2019-12-16 DIAGNOSIS — I951 Orthostatic hypotension: Secondary | ICD-10-CM | POA: Diagnosis not present

## 2019-12-16 DIAGNOSIS — R0600 Dyspnea, unspecified: Secondary | ICD-10-CM | POA: Diagnosis not present

## 2019-12-16 DIAGNOSIS — R000 Tachycardia, unspecified: Secondary | ICD-10-CM | POA: Diagnosis not present

## 2019-12-19 DIAGNOSIS — H903 Sensorineural hearing loss, bilateral: Secondary | ICD-10-CM | POA: Diagnosis not present

## 2019-12-20 DIAGNOSIS — R000 Tachycardia, unspecified: Secondary | ICD-10-CM | POA: Diagnosis not present

## 2019-12-24 DIAGNOSIS — M25562 Pain in left knee: Secondary | ICD-10-CM | POA: Diagnosis not present

## 2019-12-24 DIAGNOSIS — G8929 Other chronic pain: Secondary | ICD-10-CM | POA: Diagnosis not present

## 2019-12-24 DIAGNOSIS — M25561 Pain in right knee: Secondary | ICD-10-CM | POA: Diagnosis not present

## 2020-01-08 DIAGNOSIS — R000 Tachycardia, unspecified: Secondary | ICD-10-CM | POA: Diagnosis not present

## 2020-01-08 DIAGNOSIS — R0600 Dyspnea, unspecified: Secondary | ICD-10-CM | POA: Diagnosis not present

## 2020-02-10 DIAGNOSIS — R972 Elevated prostate specific antigen [PSA]: Secondary | ICD-10-CM | POA: Diagnosis not present

## 2020-02-10 DIAGNOSIS — N1831 Chronic kidney disease, stage 3a: Secondary | ICD-10-CM | POA: Diagnosis not present

## 2020-02-10 DIAGNOSIS — N200 Calculus of kidney: Secondary | ICD-10-CM | POA: Diagnosis not present

## 2020-02-10 DIAGNOSIS — Z20822 Contact with and (suspected) exposure to covid-19: Secondary | ICD-10-CM | POA: Diagnosis not present

## 2020-02-10 DIAGNOSIS — Z0000 Encounter for general adult medical examination without abnormal findings: Secondary | ICD-10-CM | POA: Diagnosis not present

## 2020-02-10 DIAGNOSIS — N401 Enlarged prostate with lower urinary tract symptoms: Secondary | ICD-10-CM | POA: Diagnosis not present

## 2020-03-30 DIAGNOSIS — I951 Orthostatic hypotension: Secondary | ICD-10-CM | POA: Diagnosis not present

## 2020-03-30 DIAGNOSIS — R251 Tremor, unspecified: Secondary | ICD-10-CM | POA: Diagnosis not present

## 2020-03-30 DIAGNOSIS — R0681 Apnea, not elsewhere classified: Secondary | ICD-10-CM | POA: Diagnosis not present

## 2020-03-30 DIAGNOSIS — G609 Hereditary and idiopathic neuropathy, unspecified: Secondary | ICD-10-CM | POA: Diagnosis not present

## 2020-03-30 DIAGNOSIS — M792 Neuralgia and neuritis, unspecified: Secondary | ICD-10-CM | POA: Diagnosis not present

## 2020-03-30 DIAGNOSIS — R0683 Snoring: Secondary | ICD-10-CM | POA: Diagnosis not present

## 2020-03-30 DIAGNOSIS — R61 Generalized hyperhidrosis: Secondary | ICD-10-CM | POA: Diagnosis not present

## 2020-05-19 DIAGNOSIS — K219 Gastro-esophageal reflux disease without esophagitis: Secondary | ICD-10-CM | POA: Diagnosis not present

## 2020-05-19 DIAGNOSIS — Z0000 Encounter for general adult medical examination without abnormal findings: Secondary | ICD-10-CM | POA: Diagnosis not present

## 2020-05-19 DIAGNOSIS — E782 Mixed hyperlipidemia: Secondary | ICD-10-CM | POA: Diagnosis not present

## 2020-05-19 DIAGNOSIS — M25462 Effusion, left knee: Secondary | ICD-10-CM | POA: Diagnosis not present

## 2020-05-19 DIAGNOSIS — M1711 Unilateral primary osteoarthritis, right knee: Secondary | ICD-10-CM | POA: Diagnosis not present

## 2020-05-19 DIAGNOSIS — R05 Cough: Secondary | ICD-10-CM | POA: Diagnosis not present

## 2020-05-19 DIAGNOSIS — R413 Other amnesia: Secondary | ICD-10-CM

## 2020-05-19 DIAGNOSIS — M1712 Unilateral primary osteoarthritis, left knee: Secondary | ICD-10-CM | POA: Diagnosis not present

## 2020-05-19 DIAGNOSIS — M25562 Pain in left knee: Secondary | ICD-10-CM | POA: Diagnosis not present

## 2020-05-19 DIAGNOSIS — I471 Supraventricular tachycardia: Secondary | ICD-10-CM | POA: Diagnosis not present

## 2020-05-19 DIAGNOSIS — I10 Essential (primary) hypertension: Secondary | ICD-10-CM | POA: Diagnosis not present

## 2020-05-19 DIAGNOSIS — G8929 Other chronic pain: Secondary | ICD-10-CM

## 2020-05-19 DIAGNOSIS — M25561 Pain in right knee: Secondary | ICD-10-CM | POA: Diagnosis not present

## 2020-05-19 HISTORY — DX: Other amnesia: R41.3

## 2020-05-19 HISTORY — DX: Other chronic pain: G89.29

## 2020-06-02 DIAGNOSIS — M1711 Unilateral primary osteoarthritis, right knee: Secondary | ICD-10-CM | POA: Diagnosis not present

## 2020-06-04 DIAGNOSIS — R471 Dysarthria and anarthria: Secondary | ICD-10-CM | POA: Diagnosis not present

## 2020-06-04 DIAGNOSIS — R7989 Other specified abnormal findings of blood chemistry: Secondary | ICD-10-CM | POA: Diagnosis not present

## 2020-06-04 DIAGNOSIS — Z01818 Encounter for other preprocedural examination: Secondary | ICD-10-CM | POA: Diagnosis not present

## 2020-06-05 DIAGNOSIS — R471 Dysarthria and anarthria: Secondary | ICD-10-CM | POA: Insufficient documentation

## 2020-06-05 DIAGNOSIS — R9082 White matter disease, unspecified: Secondary | ICD-10-CM | POA: Diagnosis not present

## 2020-06-05 DIAGNOSIS — R410 Disorientation, unspecified: Secondary | ICD-10-CM | POA: Diagnosis not present

## 2020-06-05 DIAGNOSIS — G9389 Other specified disorders of brain: Secondary | ICD-10-CM | POA: Diagnosis not present

## 2020-06-05 DIAGNOSIS — I6782 Cerebral ischemia: Secondary | ICD-10-CM | POA: Diagnosis not present

## 2020-06-05 HISTORY — DX: Dysarthria and anarthria: R47.1

## 2020-06-29 DIAGNOSIS — R479 Unspecified speech disturbances: Secondary | ICD-10-CM | POA: Diagnosis not present

## 2020-06-29 DIAGNOSIS — F4489 Other dissociative and conversion disorders: Secondary | ICD-10-CM | POA: Diagnosis not present

## 2020-06-29 DIAGNOSIS — M792 Neuralgia and neuritis, unspecified: Secondary | ICD-10-CM | POA: Diagnosis not present

## 2020-06-29 DIAGNOSIS — R0683 Snoring: Secondary | ICD-10-CM | POA: Diagnosis not present

## 2020-06-29 DIAGNOSIS — R0681 Apnea, not elsewhere classified: Secondary | ICD-10-CM | POA: Diagnosis not present

## 2020-06-29 DIAGNOSIS — G4733 Obstructive sleep apnea (adult) (pediatric): Secondary | ICD-10-CM | POA: Diagnosis not present

## 2020-07-04 DIAGNOSIS — Z23 Encounter for immunization: Secondary | ICD-10-CM | POA: Diagnosis not present

## 2020-07-05 DIAGNOSIS — G4733 Obstructive sleep apnea (adult) (pediatric): Secondary | ICD-10-CM

## 2020-07-05 HISTORY — DX: Obstructive sleep apnea (adult) (pediatric): G47.33

## 2020-07-06 DIAGNOSIS — I951 Orthostatic hypotension: Secondary | ICD-10-CM | POA: Diagnosis not present

## 2020-08-03 DIAGNOSIS — F809 Developmental disorder of speech and language, unspecified: Secondary | ICD-10-CM | POA: Diagnosis not present

## 2020-08-03 DIAGNOSIS — I6522 Occlusion and stenosis of left carotid artery: Secondary | ICD-10-CM | POA: Diagnosis not present

## 2020-08-03 DIAGNOSIS — R410 Disorientation, unspecified: Secondary | ICD-10-CM | POA: Diagnosis not present

## 2020-08-24 DIAGNOSIS — M47816 Spondylosis without myelopathy or radiculopathy, lumbar region: Secondary | ICD-10-CM | POA: Diagnosis not present

## 2020-08-24 DIAGNOSIS — G894 Chronic pain syndrome: Secondary | ICD-10-CM | POA: Diagnosis not present

## 2020-08-24 DIAGNOSIS — M25561 Pain in right knee: Secondary | ICD-10-CM | POA: Diagnosis not present

## 2020-08-24 DIAGNOSIS — M25562 Pain in left knee: Secondary | ICD-10-CM | POA: Diagnosis not present

## 2020-09-02 DIAGNOSIS — G4733 Obstructive sleep apnea (adult) (pediatric): Secondary | ICD-10-CM | POA: Diagnosis not present

## 2020-09-11 DIAGNOSIS — M170 Bilateral primary osteoarthritis of knee: Secondary | ICD-10-CM | POA: Diagnosis not present

## 2020-09-11 DIAGNOSIS — M25562 Pain in left knee: Secondary | ICD-10-CM | POA: Diagnosis not present

## 2020-09-11 DIAGNOSIS — M25561 Pain in right knee: Secondary | ICD-10-CM | POA: Diagnosis not present

## 2020-09-11 DIAGNOSIS — G8929 Other chronic pain: Secondary | ICD-10-CM | POA: Diagnosis not present

## 2020-09-24 DIAGNOSIS — M792 Neuralgia and neuritis, unspecified: Secondary | ICD-10-CM | POA: Diagnosis not present

## 2020-09-24 DIAGNOSIS — G4733 Obstructive sleep apnea (adult) (pediatric): Secondary | ICD-10-CM | POA: Diagnosis not present

## 2020-09-24 DIAGNOSIS — R410 Disorientation, unspecified: Secondary | ICD-10-CM | POA: Diagnosis not present

## 2020-09-24 DIAGNOSIS — G609 Hereditary and idiopathic neuropathy, unspecified: Secondary | ICD-10-CM | POA: Diagnosis not present

## 2020-10-19 DIAGNOSIS — G894 Chronic pain syndrome: Secondary | ICD-10-CM | POA: Diagnosis not present

## 2020-10-19 DIAGNOSIS — M47816 Spondylosis without myelopathy or radiculopathy, lumbar region: Secondary | ICD-10-CM | POA: Diagnosis not present

## 2020-10-19 DIAGNOSIS — M792 Neuralgia and neuritis, unspecified: Secondary | ICD-10-CM | POA: Diagnosis not present

## 2020-10-19 DIAGNOSIS — M25562 Pain in left knee: Secondary | ICD-10-CM | POA: Diagnosis not present

## 2020-10-19 DIAGNOSIS — M25561 Pain in right knee: Secondary | ICD-10-CM | POA: Diagnosis not present

## 2020-10-27 DIAGNOSIS — G3184 Mild cognitive impairment, so stated: Secondary | ICD-10-CM | POA: Diagnosis not present

## 2020-10-30 DIAGNOSIS — G3184 Mild cognitive impairment, so stated: Secondary | ICD-10-CM | POA: Insufficient documentation

## 2020-10-30 HISTORY — DX: Mild cognitive impairment of uncertain or unknown etiology: G31.84

## 2021-01-04 DIAGNOSIS — R61 Generalized hyperhidrosis: Secondary | ICD-10-CM | POA: Diagnosis not present

## 2021-01-04 DIAGNOSIS — Z5181 Encounter for therapeutic drug level monitoring: Secondary | ICD-10-CM | POA: Diagnosis not present

## 2021-01-04 DIAGNOSIS — I950 Idiopathic hypotension: Secondary | ICD-10-CM | POA: Diagnosis not present

## 2021-01-11 DIAGNOSIS — L821 Other seborrheic keratosis: Secondary | ICD-10-CM | POA: Diagnosis not present

## 2021-01-11 DIAGNOSIS — M25561 Pain in right knee: Secondary | ICD-10-CM | POA: Diagnosis not present

## 2021-01-11 DIAGNOSIS — M47816 Spondylosis without myelopathy or radiculopathy, lumbar region: Secondary | ICD-10-CM | POA: Diagnosis not present

## 2021-01-11 DIAGNOSIS — G8929 Other chronic pain: Secondary | ICD-10-CM | POA: Diagnosis not present

## 2021-01-11 DIAGNOSIS — M25562 Pain in left knee: Secondary | ICD-10-CM | POA: Diagnosis not present

## 2021-01-11 DIAGNOSIS — L820 Inflamed seborrheic keratosis: Secondary | ICD-10-CM | POA: Diagnosis not present

## 2021-01-11 DIAGNOSIS — G629 Polyneuropathy, unspecified: Secondary | ICD-10-CM | POA: Diagnosis not present

## 2021-01-11 DIAGNOSIS — L578 Other skin changes due to chronic exposure to nonionizing radiation: Secondary | ICD-10-CM | POA: Diagnosis not present

## 2021-01-26 DIAGNOSIS — M25561 Pain in right knee: Secondary | ICD-10-CM | POA: Diagnosis not present

## 2021-01-26 DIAGNOSIS — M25562 Pain in left knee: Secondary | ICD-10-CM | POA: Diagnosis not present

## 2021-01-26 DIAGNOSIS — G8929 Other chronic pain: Secondary | ICD-10-CM | POA: Diagnosis not present

## 2021-02-18 DIAGNOSIS — N200 Calculus of kidney: Secondary | ICD-10-CM | POA: Diagnosis not present

## 2021-02-18 DIAGNOSIS — R361 Hematospermia: Secondary | ICD-10-CM | POA: Diagnosis not present

## 2021-02-18 DIAGNOSIS — N401 Enlarged prostate with lower urinary tract symptoms: Secondary | ICD-10-CM | POA: Diagnosis not present

## 2021-02-18 DIAGNOSIS — R972 Elevated prostate specific antigen [PSA]: Secondary | ICD-10-CM | POA: Diagnosis not present

## 2021-02-18 DIAGNOSIS — N1831 Chronic kidney disease, stage 3a: Secondary | ICD-10-CM | POA: Diagnosis not present

## 2021-02-24 DIAGNOSIS — M1712 Unilateral primary osteoarthritis, left knee: Secondary | ICD-10-CM | POA: Diagnosis not present

## 2021-02-24 DIAGNOSIS — G8929 Other chronic pain: Secondary | ICD-10-CM | POA: Diagnosis not present

## 2021-02-24 DIAGNOSIS — M25562 Pain in left knee: Secondary | ICD-10-CM | POA: Diagnosis not present

## 2021-03-10 DIAGNOSIS — M25561 Pain in right knee: Secondary | ICD-10-CM | POA: Diagnosis not present

## 2021-03-10 DIAGNOSIS — G8929 Other chronic pain: Secondary | ICD-10-CM | POA: Diagnosis not present

## 2021-03-10 DIAGNOSIS — M25562 Pain in left knee: Secondary | ICD-10-CM | POA: Diagnosis not present

## 2021-03-10 DIAGNOSIS — M1711 Unilateral primary osteoarthritis, right knee: Secondary | ICD-10-CM | POA: Diagnosis not present

## 2021-03-15 DIAGNOSIS — G4733 Obstructive sleep apnea (adult) (pediatric): Secondary | ICD-10-CM | POA: Diagnosis not present

## 2021-03-30 DIAGNOSIS — G4733 Obstructive sleep apnea (adult) (pediatric): Secondary | ICD-10-CM | POA: Diagnosis not present

## 2021-04-06 DIAGNOSIS — I951 Orthostatic hypotension: Secondary | ICD-10-CM | POA: Diagnosis not present

## 2021-04-06 DIAGNOSIS — R61 Generalized hyperhidrosis: Secondary | ICD-10-CM | POA: Diagnosis not present

## 2021-04-06 DIAGNOSIS — I10 Essential (primary) hypertension: Secondary | ICD-10-CM | POA: Diagnosis not present

## 2021-04-06 DIAGNOSIS — Z5181 Encounter for therapeutic drug level monitoring: Secondary | ICD-10-CM | POA: Diagnosis not present

## 2021-04-13 DIAGNOSIS — M25562 Pain in left knee: Secondary | ICD-10-CM | POA: Diagnosis not present

## 2021-04-13 DIAGNOSIS — Z5181 Encounter for therapeutic drug level monitoring: Secondary | ICD-10-CM | POA: Diagnosis not present

## 2021-04-13 DIAGNOSIS — M25561 Pain in right knee: Secondary | ICD-10-CM | POA: Diagnosis not present

## 2021-04-13 DIAGNOSIS — I951 Orthostatic hypotension: Secondary | ICD-10-CM | POA: Diagnosis not present

## 2021-04-13 DIAGNOSIS — R61 Generalized hyperhidrosis: Secondary | ICD-10-CM | POA: Diagnosis not present

## 2021-04-13 DIAGNOSIS — G894 Chronic pain syndrome: Secondary | ICD-10-CM | POA: Diagnosis not present

## 2021-04-14 DIAGNOSIS — G4733 Obstructive sleep apnea (adult) (pediatric): Secondary | ICD-10-CM | POA: Diagnosis not present

## 2021-05-10 DIAGNOSIS — G609 Hereditary and idiopathic neuropathy, unspecified: Secondary | ICD-10-CM | POA: Diagnosis not present

## 2021-05-10 DEATH — deceased

## 2021-05-13 DIAGNOSIS — G4733 Obstructive sleep apnea (adult) (pediatric): Secondary | ICD-10-CM | POA: Diagnosis not present

## 2021-05-15 DIAGNOSIS — G4733 Obstructive sleep apnea (adult) (pediatric): Secondary | ICD-10-CM | POA: Diagnosis not present

## 2021-05-24 DIAGNOSIS — L300 Nummular dermatitis: Secondary | ICD-10-CM | POA: Diagnosis not present

## 2021-05-24 DIAGNOSIS — L299 Pruritus, unspecified: Secondary | ICD-10-CM | POA: Diagnosis not present

## 2021-05-24 DIAGNOSIS — L0100 Impetigo, unspecified: Secondary | ICD-10-CM | POA: Diagnosis not present

## 2021-06-01 DIAGNOSIS — N1831 Chronic kidney disease, stage 3a: Secondary | ICD-10-CM | POA: Diagnosis not present

## 2021-06-01 DIAGNOSIS — K219 Gastro-esophageal reflux disease without esophagitis: Secondary | ICD-10-CM | POA: Diagnosis not present

## 2021-06-01 DIAGNOSIS — Z1211 Encounter for screening for malignant neoplasm of colon: Secondary | ICD-10-CM | POA: Diagnosis not present

## 2021-06-01 DIAGNOSIS — I10 Essential (primary) hypertension: Secondary | ICD-10-CM | POA: Diagnosis not present

## 2021-06-01 DIAGNOSIS — Z0000 Encounter for general adult medical examination without abnormal findings: Secondary | ICD-10-CM | POA: Diagnosis not present

## 2021-06-01 DIAGNOSIS — I471 Supraventricular tachycardia: Secondary | ICD-10-CM | POA: Diagnosis not present

## 2021-06-01 DIAGNOSIS — J302 Other seasonal allergic rhinitis: Secondary | ICD-10-CM | POA: Diagnosis not present

## 2021-06-01 DIAGNOSIS — I129 Hypertensive chronic kidney disease with stage 1 through stage 4 chronic kidney disease, or unspecified chronic kidney disease: Secondary | ICD-10-CM | POA: Diagnosis not present

## 2021-06-01 DIAGNOSIS — G609 Hereditary and idiopathic neuropathy, unspecified: Secondary | ICD-10-CM | POA: Diagnosis not present

## 2021-06-01 DIAGNOSIS — E782 Mixed hyperlipidemia: Secondary | ICD-10-CM | POA: Diagnosis not present

## 2021-06-07 DIAGNOSIS — R7309 Other abnormal glucose: Secondary | ICD-10-CM | POA: Diagnosis not present

## 2021-06-15 DIAGNOSIS — G4733 Obstructive sleep apnea (adult) (pediatric): Secondary | ICD-10-CM | POA: Diagnosis not present

## 2021-06-22 DIAGNOSIS — M1711 Unilateral primary osteoarthritis, right knee: Secondary | ICD-10-CM | POA: Diagnosis not present

## 2021-07-09 DIAGNOSIS — G4733 Obstructive sleep apnea (adult) (pediatric): Secondary | ICD-10-CM | POA: Diagnosis not present

## 2021-07-12 DIAGNOSIS — G4733 Obstructive sleep apnea (adult) (pediatric): Secondary | ICD-10-CM | POA: Diagnosis not present

## 2021-07-15 DIAGNOSIS — G4733 Obstructive sleep apnea (adult) (pediatric): Secondary | ICD-10-CM | POA: Diagnosis not present

## 2021-08-12 DIAGNOSIS — M792 Neuralgia and neuritis, unspecified: Secondary | ICD-10-CM | POA: Diagnosis not present

## 2021-08-12 DIAGNOSIS — M47816 Spondylosis without myelopathy or radiculopathy, lumbar region: Secondary | ICD-10-CM | POA: Diagnosis not present

## 2021-08-12 DIAGNOSIS — M25562 Pain in left knee: Secondary | ICD-10-CM | POA: Diagnosis not present

## 2021-08-12 DIAGNOSIS — G894 Chronic pain syndrome: Secondary | ICD-10-CM | POA: Diagnosis not present

## 2021-08-12 DIAGNOSIS — M25561 Pain in right knee: Secondary | ICD-10-CM | POA: Diagnosis not present

## 2021-08-15 DIAGNOSIS — G4733 Obstructive sleep apnea (adult) (pediatric): Secondary | ICD-10-CM | POA: Diagnosis not present

## 2021-08-24 DIAGNOSIS — R61 Generalized hyperhidrosis: Secondary | ICD-10-CM | POA: Diagnosis not present

## 2021-08-24 DIAGNOSIS — I951 Orthostatic hypotension: Secondary | ICD-10-CM | POA: Diagnosis not present

## 2021-08-24 DIAGNOSIS — Z5181 Encounter for therapeutic drug level monitoring: Secondary | ICD-10-CM | POA: Diagnosis not present

## 2021-11-02 ENCOUNTER — Other Ambulatory Visit: Payer: Self-pay

## 2021-11-04 ENCOUNTER — Ambulatory Visit: Payer: PPO | Admitting: Cardiology

## 2021-11-11 ENCOUNTER — Other Ambulatory Visit: Payer: Self-pay

## 2021-11-11 ENCOUNTER — Encounter: Payer: Self-pay | Admitting: Cardiology

## 2021-11-11 ENCOUNTER — Ambulatory Visit (INDEPENDENT_AMBULATORY_CARE_PROVIDER_SITE_OTHER): Payer: PPO | Admitting: Cardiology

## 2021-11-11 VITALS — BP 124/70 | HR 90 | Ht 74.0 in | Wt 225.6 lb

## 2021-11-11 DIAGNOSIS — Z01810 Encounter for preprocedural cardiovascular examination: Secondary | ICD-10-CM | POA: Diagnosis not present

## 2021-11-11 DIAGNOSIS — E782 Mixed hyperlipidemia: Secondary | ICD-10-CM | POA: Diagnosis not present

## 2021-11-11 DIAGNOSIS — I471 Supraventricular tachycardia: Secondary | ICD-10-CM | POA: Diagnosis not present

## 2021-11-11 DIAGNOSIS — I951 Orthostatic hypotension: Secondary | ICD-10-CM

## 2021-11-11 NOTE — Patient Instructions (Signed)
[phone] Reuben Likes, RN  Medication Instructions:  Your physician recommends that you continue on your current medications as directed. Please refer to the Current Medication list given to you today.  *If you need a refill on your cardiac medications before your next appointment, please call your pharmacy*   Lab Work: None ordered If you have labs (blood work) drawn today and your tests are completely normal, you will receive your results only by: [place] (if you have MyChart) OR A paper copy in the mail If you have any lab test that is abnormal or we need to change your treatment, we will call you to review the results.   Testing/Procedures: Your physician has requested that you have a lexiscan myoview. For further information please visit [email or web address]. Please follow instruction sheet, as given.  The test will take approximately 3 to 4 hours to complete; you may bring reading material.  If someone comes with you to your appointment, they will need to remain in the main lobby due to limited space in the testing area.   How to prepare for your Myocardial Perfusion Test: Do not eat or drink 3 hours prior to your test, except you may have water. Do not consume products containing caffeine (regular or decaffeinated) 12 hours prior to your test. (ex: coffee, chocolate, sodas, tea). Do bring a list of your current medications with you.  If not listed below, you may take your medications as normal. Do wear comfortable clothes (no dresses or overalls) and walking shoes, tennis shoes preferred (No heels or open toe shoes are allowed). Do NOT wear cologne, perfume, aftershave, or lotions (deodorant is allowed). If these instructions are not followed, your test will have to be rescheduled.  Your physician has requested that you have an echocardiogram. Echocardiography is a painless test that uses sound waves to create images of your heart. It provides your doctor with information  about the size and shape of your heart and how well your hearts chambers and valves are working. This procedure takes approximately one hour. There are no restrictions for this procedure.   Follow-Up: At [facility], you and your health needs are our priority.  As part of our continuing mission to provide you with exceptional heart care, we have created designated Provider Care Teams.  These Care Teams include your primary Cardiologist (physician) and Advanced Practice Providers (APPs -  Physician Assistants and Nurse Practitioners) who all work together to provide you with the care you need, when you need it.  We recommend signing up for the patient portal called "MyChart".  Sign up information is provided on this After Visit Summary.  MyChart is used to connect with patients for Virtual Visits (Telemedicine).  Patients are able to view lab/test results, encounter notes, upcoming appointments, etc.  Non-urgent messages can be sent to your provider as well.   To learn more about what you can do with MyChart, go to [email or web address].    Your next appointment:   As needed  The format for your next appointment:   In Person  Provider:   Jyl Heinz, MD   Other Instructions Cardiac Nuclear Scan A cardiac nuclear scan is a test that is done to check the flow of blood to your heart. It is done when you are resting and when you are exercising. The test looks for problems such as: Not enough blood reaching a portion of the heart. The heart muscle not working as it should. You may need this test if: You have  heart disease. You have had lab results that are not normal. You have had heart surgery or a balloon procedure to open up blocked arteries (angioplasty). You have chest pain. You have shortness of breath. In this test, a special dye (tracer) is put into your bloodstream. The tracer will travel to your heart. A camera will then take pictures of your heart to see how the tracer  moves through your heart. This test is usually done at a hospital and takes 2-4 hours. Tell a doctor about: Any allergies you have. All medicines you are taking, including vitamins, herbs, eye drops, creams, and over-the-counter medicines. Any problems you or family members have had with anesthetic medicines. Any blood disorders you have. Any surgeries you have had. Any medical conditions you have. Whether you are pregnant or may be pregnant. What are the risks? Generally, this is a safe test. However, problems may occur, such as: Serious chest pain and heart attack. This is only a risk if the stress portion of the test is done. Rapid heartbeat. A feeling of warmth in your chest. This feeling usually does not last long. Allergic reaction to the tracer. What happens before the test? Ask your doctor about changing or stopping your normal medicines. This is important. Follow instructions from your doctor about what you cannot eat or drink. Remove your jewelry on the day of the test. What happens during the test? An IV tube will be inserted into one of your veins. Your doctor will give you a small amount of tracer through the IV tube. You will wait for 20-40 minutes while the tracer moves through your bloodstream. Your heart will be monitored with an electrocardiogram (ECG). You will lie down on an exam table. Pictures of your heart will be taken for about 15-20 minutes. You may also have a stress test. For this test, one of these things may be done: You will be asked to exercise on a treadmill or a stationary bike. You will be given medicines that will make your heart work harder. This is done if you are unable to exercise. When blood flow to your heart has peaked, a tracer will again be given through the IV tube. After 20-40 minutes, you will get back on the exam table. More pictures will be taken of your heart. Depending on the tracer that is used, more pictures may need to be taken 3-4  hours later. Your IV tube will be removed when the test is over. The test may vary among doctors and hospitals. What happens after the test? Ask your doctor: Whether you can return to your normal schedule, including diet, activities, and medicines. Whether you should drink more fluids. This will help to remove the tracer from your body. Drink enough fluid to keep your pee (urine) pale yellow. Ask your doctor, or the department that is doing the test: When will my results be ready? How will I get my results? Summary A cardiac nuclear scan is a test that is done to check the flow of blood to your heart. Tell your doctor whether you are pregnant or may be pregnant. Before the test, ask your doctor about changing or stopping your normal medicines. This is important. Ask your doctor whether you can return to your normal activities. You may be asked to drink more fluids. This information is not intended to replace advice given to you by your health care provider. Make sure you discuss any questions you have with your health care provider. Document Revised:  01/16/2019 Document Reviewed: 03/12/2018 Elsevier Patient Education  2021 [place].    Echocardiogram An echocardiogram is a test that uses sound waves (ultrasound) to produce images of the heart. Images from an echocardiogram can provide important information about: Heart size and shape. The size and thickness and movement of your heart's walls. Heart muscle function and strength. Heart valve function or if you have stenosis. Stenosis is when the heart valves are too narrow. If blood is flowing backward through the heart valves (regurgitation). A tumor or infectious growth around the heart valves. Areas of heart muscle that are not working well because of poor blood flow or injury from a heart attack. Aneurysm detection. An aneurysm is a weak or damaged part of an artery wall. The wall bulges out from the normal force of blood  pumping through the body. Tell a health care provider about: Any allergies you have. All medicines you are taking, including vitamins, herbs, eye drops, creams, and over-the-counter medicines. Any blood disorders you have. Any surgeries you have had. Any medical conditions you have. Whether you are pregnant or may be pregnant. What are the risks? Generally, this is a safe test. However, problems may occur, including an allergic reaction to dye (contrast) that may be used during the test. What happens before the test? No specific preparation is needed. You may eat and drink normally. What happens during the test? You will take off your clothes from the waist up and put on a hospital gown. Electrodes or electrocardiogram (ECG)patches may be placed on your chest. The electrodes or patches are then connected to a device that monitors your heart rate and rhythm. You will lie down on a table for an ultrasound exam. A gel will be applied to your chest to help sound waves pass through your skin. A handheld device, called a transducer, will be pressed against your chest and moved over your heart. The transducer produces sound waves that travel to your heart and bounce back (or "echo" back) to the transducer. These sound waves will be captured in real-time and changed into images of your heart that can be viewed on a video monitor. The images will be recorded on a computer and reviewed by your health care provider. You may be asked to change positions or hold your breath for a short time. This makes it easier to get different views or better views of your heart. In some cases, you may receive contrast through an IV in one of your veins. This can improve the quality of the pictures from your heart. The procedure may vary among health care providers and hospitals.    What can I expect after the test? You may return to your normal, everyday life, including diet, activities, and medicines, unless your health  care provider tells you not to do that. Follow these instructions at home: It is up to you to get the results of your test. Ask your health care provider, or the department that is doing the test, when your results will be ready. Keep all follow-up visits. This is important. Summary An echocardiogram is a test that uses sound waves (ultrasound) to produce images of the heart. Images from an echocardiogram can provide important information about the size and shape of your heart, heart muscle function, heart valve function, and other possible heart problems. You do not need to do anything to prepare before this test. You may eat and drink normally. After the echocardiogram is completed, you may return to your normal,  everyday life, unless your health care provider tells you not to do that. This information is not intended to replace advice given to you by your health care provider. Make sure you discuss any questions you have with your health care provider. Document Revised: 05/19/2020 Document Reviewed: 05/19/2020 Elsevier Patient Education  2021 [employer].

## 2021-11-11 NOTE — Progress Notes (Signed)
Cardiology Office Note:    Date:  11/11/2021   ID:  D'ARCY ABRAHA, DOB July 18, 1946, MRN [id number]  PCP:  [name], MD  Cardiologist:  Jenean Lindau, MD   Referring MD: [name], MD    ASSESSMENT:    1. PAT (paroxysmal atrial tachycardia) ([place])   2. Orthostatic hypotension   3. Hyperlipidemia, mixed   4. Preop cardiovascular exam    PLAN:    In order of problems listed above:  Primary prevention stressed with the patient.  Importance of compliance with diet medication stressed any vocalized understanding Preop cardiovascular evaluation: I discussed this with him at length.  He leads a sedentary lifestyle because of neurological issues.  We will do a Lexiscan sestamibi to assess his cardiovascular status.  If this is negative then is not at high risk for coronary events during the aforementioned surgery.  Medical hemodynamic monitoring will further reduce the risk of coronary events. Cardiac murmur: Echocardiogram will be done to assess murmur heard on auscultation. Mixed dyslipidemia: Lipids followed by primary care. He will be seen in follow-up appointment on a as needed basis.   Medication Adjustments/Labs and Tests Ordered: Current medicines are reviewed at length with the patient today.  Concerns regarding medicines are outlined above.  No orders of the defined types were placed in this encounter.  No orders of the defined types were placed in this encounter.    History of Present Illness:    Christopher Mclean is a 76 y.o. male who is being seen today for the evaluation of preop cardiovascular evaluation at the request of [name], MD. patient has past medical history of orthostatic hypotension which is currently better.  He has has history of neuropathy of unclear details.  He has history of mixed dyslipidemia.  He is here for evaluation for preoperative assessment for knee surgery. He denies any chest pain orthopnea or PND.  Past Medical  History:  Diagnosis Date   Acute non-recurrent pansinusitis 11/14/2019   Benign non-nodular prostatic hyperplasia with lower urinary tract symptoms 11/10/2014   Chronic pain of right knee 05/19/2020   Chronic prostatitis 11/10/2014   Chronic right shoulder pain 02/13/2018   DOE (dyspnea on exertion) 03/16/2018   Dysarthria 06/05/2020   Elevated prostate specific antigen (PSA) 05/11/2012   Essential hypertension 06/07/2018   Fatigue 03/16/2018   GERD (gastroesophageal reflux disease) 08/17/2017   Hyperlipidemia, mixed 06/18/2019   Idiopathic peripheral neuropathy 09/13/2018   Long-term use of aspirin therapy 09/11/2018   Mild neurocognitive disorder 10/30/2020   Formatting of this note might be different from the original. likely secondary to untreated sleep apnea and hearing difficulties   Mixed dyslipidemia 05/09/2018   Moderate obstructive sleep apnea 07/05/2020   Orthostatic hypotension 09/11/2018   Palpitations 06/27/2017   PAT (paroxysmal atrial tachycardia) ([place]) 05/14/2018   Formatting of this note might be different from the original. 09/19/2017-Rf ablation of right atrial tachycardia Formatting of this note might be different from the original. History of ablation   S/P ablation operation for arrhythmia 09/19/2017   Short-term memory loss 05/19/2020   Sinus bradycardia 03/04/2014   Spondylosis without myelopathy or radiculopathy, lumbar region 02/20/2019   Formatting of this note might be different from the original. Added automatically from request for surgery [id number] Formatting of this note might be different from the original. Added automatically from request for surgery [id number]   Stage 3 chronic kidney disease ([place]) 01/20/2014   Suspected COVID-19 virus infection 11/14/2019  Past Surgical History:  Procedure Laterality Date   ablation     KNEE ARTHROSCOPY Bilateral     Current Medications: Current Meds  Medication Sig   aspirin EC 81 MG tablet Take 81 mg by mouth daily.   cetirizine (ZYRTEC) 10  MG tablet Take 10 mg by mouth daily.    Cholecalciferol (VITAMIN D) 2000 units tablet Take 2,000 Units by mouth daily.    docusate sodium (COLACE) 100 MG capsule Take 100 mg by mouth daily as needed.    DULoxetine (CYMBALTA) 60 MG capsule Take 1 capsule by mouth 2 (two) times daily.   famotidine (PEPCID) 40 MG tablet Take 1 tablet by mouth at bedtime.   fenofibrate micronized (LOFIBRA) 134 MG capsule Take 134 mg by mouth daily before breakfast.    fluticasone (FLONASE) 50 MCG/ACT nasal spray Place 1 spray into both nostrils as needed for allergies or rhinitis.   glycopyrrolate (ROBINUL) 1 MG tablet Take 1 mg by mouth 2 (two) times daily.   Lactobacillus Rhamnosus, GG, (RA PROBIOTIC DIGESTIVE CARE) CAPS Take 1 capsule by mouth daily.   meloxicam (MOBIC) 7.5 MG tablet Take 7.5 mg by mouth daily.    midodrine (PROAMATINE) 10 MG tablet Take 1 tablet by mouth in the morning, at noon, and at bedtime.   montelukast (SINGULAIR) 10 MG tablet Take 10 mg by mouth at bedtime.    montelukast (SINGULAIR) 10 MG tablet Take 1 tablet by mouth at bedtime.   nortriptyline (PAMELOR) 10 MG capsule Take 10 mg by mouth at bedtime.    OXcarbazepine (TRILEPTAL) 150 MG tablet Take 1 tablet by mouth 2 (two) times daily. 1 tab am and 2 tabs pm   pantoprazole (PROTONIX) 40 MG tablet Take 1 tablet by mouth 2 (two) times daily.   pregabalin (LYRICA) 75 MG capsule Take 75 mg by mouth 2 (two) times daily.   tamsulosin (FLOMAX) 0.4 MG CAPS capsule Take 1 capsule by mouth daily.   topiramate (TOPAMAX) 25 MG tablet Take 2 tablets by mouth 2 (two) times daily.     Allergies:   Levofloxacin and Sulfa antibiotics   Social History   Socioeconomic History   Marital status: Married    Spouse name: Not on file   Number of children: Not on file   Years of education: Not on file   Highest education level: Not on file  Occupational History   Not on file  Tobacco Use   Smoking status: Never   Smokeless tobacco: Never   Substance and Sexual Activity   Alcohol use: Not on file   Drug use: Not on file   Sexual activity: Not on file  Other Topics Concern   Not on file  Social History Narrative   Not on file   Social Determinants of Health   Financial Resource Strain: Not on file  Food Insecurity: Not on file  Transportation Needs: Not on file  Physical Activity: Not on file  Stress: Not on file  Social Connections: Not on file     Family History: The patient's family history is negative for Heart disease, Hypertension, Diabetes, and Cancer.  ROS:   Please see the history of present illness.    All other systems reviewed and are negative.  EKGs/Labs/Other Studies Reviewed:    The following studies were reviewed today: EKG reveals sinus rhythm and nonspecific ST-T changes   Recent Labs: No results found for requested labs within last 8760 hours.  Recent Lipid Panel No results found for: CHOL, TRIG,  HDL, CHOLHDL, VLDL, LDLCALC, LDLDIRECT  Physical Exam:    VS:  BP 124/70    Pulse 90    Ht 6\' 2"  (1.88 m)    Wt 225 lb 9.6 oz (102.3 kg)    SpO2 97%    BMI 28.97 kg/m     Wt Readings from Last 3 Encounters:  11/11/21 225 lb 9.6 oz (102.3 kg)     GEN: Patient is in no acute distress HEENT: Normal NECK: No JVD; No carotid bruits LYMPHATICS: No lymphadenopathy CARDIAC: S1 S2 regular, 2/6 systolic murmur at the apex. RESPIRATORY:  Clear to auscultation without rales, wheezing or rhonchi  ABDOMEN: Soft, non-tender, non-distended MUSCULOSKELETAL:  No edema; No deformity  SKIN: Warm and dry NEUROLOGIC:  Alert and oriented x 3 PSYCHIATRIC:  Normal affect    Signed, Jenean Lindau, MD  11/11/2021 4:16 PM    [facility]

## 2021-11-18 ENCOUNTER — Telehealth (HOSPITAL_COMMUNITY): Payer: Self-pay | Admitting: *Deleted

## 2021-11-18 NOTE — Telephone Encounter (Signed)
Left message on voicemail per DPR in reference to upcoming appointment scheduled on  11/25/21 with detailed instructions given per Myocardial Perfusion Study Information Sheet for the test. LM to arrive 15 minutes early, and that it is imperative to arrive on time for appointment to keep from having the test rescheduled. If you need to cancel or reschedule your appointment, please call the office within 24 hours of your appointment. Failure to do so may result in a cancellation of your appointment, and a $50 no show fee. Phone number given for call back for any questions. Kirstie Peri

## 2021-11-25 ENCOUNTER — Ambulatory Visit: Payer: PPO

## 2021-11-25 ENCOUNTER — Ambulatory Visit (INDEPENDENT_AMBULATORY_CARE_PROVIDER_SITE_OTHER): Payer: PPO

## 2021-11-25 ENCOUNTER — Other Ambulatory Visit: Payer: Self-pay

## 2021-11-25 DIAGNOSIS — Z01810 Encounter for preprocedural cardiovascular examination: Secondary | ICD-10-CM

## 2021-11-25 LAB — MYOCARDIAL PERFUSION IMAGING
LV dias vol: 89 mL (ref 62–150)
LV sys vol: 36 mL
Nuc Stress EF: 60 %
Peak HR: 86 {beats}/min
Rest HR: 61 {beats}/min
Rest Nuclear Isotope Dose: 10.9 mCi
SDS: 1
SRS: 0
SSS: 1
Stress Nuclear Isotope Dose: 29.2 mCi
TID: 0.96

## 2021-11-25 MED ORDER — REGADENOSON 0.4 MG/5ML IV SOLN
0.4000 mg | Freq: Once | INTRAVENOUS | Status: AC
  Administered 2021-11-25: 0.4 mg via INTRAVENOUS

## 2021-11-25 MED ORDER — TECHNETIUM TC 99M TETROFOSMIN IV KIT
10.9000 | PACK | Freq: Once | INTRAVENOUS | Status: AC | PRN
  Administered 2021-11-25: 10.9 via INTRAVENOUS

## 2021-11-25 MED ORDER — TECHNETIUM TC 99M TETROFOSMIN IV KIT
29.2000 | PACK | Freq: Once | INTRAVENOUS | Status: AC | PRN
  Administered 2021-11-25: 29.2 via INTRAVENOUS

## 2021-11-30 ENCOUNTER — Other Ambulatory Visit: Payer: Self-pay

## 2021-11-30 ENCOUNTER — Ambulatory Visit (INDEPENDENT_AMBULATORY_CARE_PROVIDER_SITE_OTHER): Payer: PPO

## 2021-11-30 DIAGNOSIS — Z01810 Encounter for preprocedural cardiovascular examination: Secondary | ICD-10-CM | POA: Diagnosis not present

## 2021-11-30 LAB — ECHOCARDIOGRAM COMPLETE
Area-P 1/2: 2.48 cm2
MV M vel: 5.41 m/s
MV Peak grad: 117.1 mmHg
Radius: 0.5 cm
S' Lateral: 3.7 cm

## 2021-12-01 ENCOUNTER — Encounter: Payer: Self-pay | Admitting: Cardiology

## 2021-12-15 ENCOUNTER — Ambulatory Visit: Payer: PPO | Admitting: Cardiology

## 2022-06-28 ENCOUNTER — Telehealth: Payer: Self-pay | Admitting: Cardiology

## 2022-06-28 NOTE — Telephone Encounter (Signed)
Pt c/o BP issue: STAT if pt c/o blurred vision, one-sided weakness or slurred speech  1. What are your last 5 BP readings?  86/64 HR 82 106/69 HR 98 115/82 HR 80  111/71 HR 91 87/58 HR 115  2. Are you having any other symptoms (ex. Dizziness, headache, blurred vision, passed out)? Dizziness at times.   3. What is your BP issue?  Pt states that he just feels off and lethargic when his top number goes down. Requesting call back to discuss and scheduled appt with Dr. Geraldo Pitter for 09/21.

## 2022-06-28 NOTE — Telephone Encounter (Signed)
Pt wanted to go over his blood pressures and his symptoms before his appt on Thursday. Encouraged him to eat well drink plenty of fluids and bring his readings with him to the appt. Pt agreed and verbalized understanding and will be here on 06-30-22.

## 2022-06-30 ENCOUNTER — Encounter: Payer: Self-pay | Admitting: Cardiology

## 2022-06-30 ENCOUNTER — Ambulatory Visit: Payer: PPO | Attending: Cardiology | Admitting: Cardiology

## 2022-06-30 VITALS — BP 90/58 | HR 90 | Ht 74.0 in | Wt 225.0 lb

## 2022-06-30 DIAGNOSIS — Z8679 Personal history of other diseases of the circulatory system: Secondary | ICD-10-CM | POA: Diagnosis not present

## 2022-06-30 DIAGNOSIS — I950 Idiopathic hypotension: Secondary | ICD-10-CM

## 2022-06-30 DIAGNOSIS — Z9889 Other specified postprocedural states: Secondary | ICD-10-CM

## 2022-06-30 DIAGNOSIS — I959 Hypotension, unspecified: Secondary | ICD-10-CM | POA: Insufficient documentation

## 2022-06-30 NOTE — Patient Instructions (Signed)

## 2022-06-30 NOTE — Progress Notes (Signed)
Cardiology Office Note:    Date:  06/30/2022   ID:  Christopher Mclean, DOB 1946/09/12, MRN [id number]  PCP:  Christopher Broker, MD  Cardiologist:  Christopher Lindau, MD   Referring MD: Christopher Broker, MD    ASSESSMENT:    1. S/P ablation operation for arrhythmia   2. Idiopathic hypotension    PLAN:    In order of problems listed above:  Primary prevention stressed with the patient.  Importance of compliance with diet medication stressed and he vocalized understanding. He has undergone knee surgery and is doing the rehab. Hypotension: He is on midodrine.  I told him to take extra salt and water in his diet.  I told him about maneuvers he can do before he stands up like ankle flexion and raising his arms above his head and clenching his fists to get his blood pressure is better and then to use a walker sleep since he has had knee surgery.  Fall precautions were explained extensively.  Compression stocking use was also advised and he promises to follow this.  He will keep a track of his blood pressures and get back to [place] in the next week or 2. Patient will be seen in follow-up appointment in 6 months or earlier if the patient has any concerns    Medication Adjustments/Labs and Tests Ordered: Current medicines are reviewed at length with the patient today.  Concerns regarding medicines are outlined above.  No orders of the defined types were placed in this encounter.  No orders of the defined types were placed in this encounter.    Chief Complaint  Patient presents with   Follow-up     History of Present Illness:    Christopher Mclean is a 76 y.o. male.  Patient has past medical history of SVT post ablation.  He mentions to me that his blood pressure is borderline.  He is on midodrine.  He does not take adequate salt in his diet.  He denies any history of dizziness or syncope.  When he stands up sometimes he feels a little lightheaded.  At the time of my evaluation, the patient is  alert awake oriented and in no distress.  Past Medical History:  Diagnosis Date   Acute non-recurrent pansinusitis 11/14/2019   Benign non-nodular prostatic hyperplasia with lower urinary tract symptoms 11/10/2014   Chronic pain of right knee 05/19/2020   Chronic prostatitis 11/10/2014   Chronic right shoulder pain 02/13/2018   DOE (dyspnea on exertion) 03/16/2018   Dysarthria 06/05/2020   Elevated prostate specific antigen (PSA) 05/11/2012   Essential hypertension 06/07/2018   Fatigue 03/16/2018   GERD (gastroesophageal reflux disease) 08/17/2017   Hyperlipidemia, mixed 06/18/2019   Idiopathic peripheral neuropathy 09/13/2018   Long-term use of aspirin therapy 09/11/2018   Mild neurocognitive disorder 10/30/2020   Formatting of this note might be different from the original. likely secondary to untreated sleep apnea and hearing difficulties   Mixed dyslipidemia 05/09/2018   Moderate obstructive sleep apnea 07/05/2020   Orthostatic hypotension 09/11/2018   Palpitations 06/27/2017   PAT (paroxysmal atrial tachycardia) ([place]) 05/14/2018   Formatting of this note might be different from the original. 09/19/2017-Rf ablation of right atrial tachycardia Formatting of this note might be different from the original. History of ablation   S/P ablation operation for arrhythmia 09/19/2017   Short-term memory loss 05/19/2020   Sinus bradycardia 03/04/2014   Spondylosis without myelopathy or radiculopathy, lumbar region 02/20/2019   Formatting of this note  might be different from the original. Added automatically from request for surgery [phone] Formatting of this note might be different from the original. Added automatically from request for surgery [phone]   Stage 3 chronic kidney disease ([place]) 01/20/2014   Suspected COVID-19 virus infection 11/14/2019    Past Surgical History:  Procedure Laterality Date   ablation     KNEE ARTHROSCOPY Bilateral     Current Medications: Current Meds  Medication Sig   aspirin EC 81 MG  tablet Take 81 mg by mouth daily.   Calcium-Magnesium-Vitamin D (CALCIUM MAGNESIUM PO) Take 1 tablet by mouth daily. Zinc plus   calcium-vitamin D (OSCAL WITH D) 500-5 MG-MCG tablet Take 1 tablet by mouth daily with breakfast.   cetirizine (ZYRTEC) 10 MG tablet Take 10 mg by mouth daily.    Cholecalciferol (VITAMIN D) 2000 units tablet Take 2,000 Units by mouth daily.    docusate sodium (COLACE) 100 MG capsule Take 100 mg by mouth daily as needed.    DULoxetine (CYMBALTA) 60 MG capsule Take 1 capsule by mouth 2 (two) times daily.   famotidine (PEPCID) 40 MG tablet Take 1 tablet by mouth at bedtime.   fenofibrate micronized (LOFIBRA) 134 MG capsule Take 134 mg by mouth daily before breakfast.    Ferrous Sulfate (IRON PO) Take 130 mg by mouth daily.   glycopyrrolate (ROBINUL) 1 MG tablet Take 1 mg by mouth 2 (two) times daily.   Lactobacillus Rhamnosus, GG, (RA PROBIOTIC DIGESTIVE CARE) CAPS Take 1 capsule by mouth daily.   midodrine (PROAMATINE) 10 MG tablet Take 10 mg by mouth every morning. And 1/2 ('5mg'$ ) 4 hours later   montelukast (SINGULAIR) 10 MG tablet Take 1 tablet by mouth at bedtime.   OXcarbazepine (TRILEPTAL) 150 MG tablet Take 300 mg by mouth 2 (two) times daily.   pantoprazole (PROTONIX) 40 MG tablet Take 1 tablet by mouth 2 (two) times daily.   tamsulosin (FLOMAX) 0.4 MG CAPS capsule Take 1 capsule by mouth daily.   TURMERIC-GINGER PO Take 1 tablet by mouth daily. Turmeric 500 mg and Ginger 50 mg     Allergies:   Levofloxacin and Sulfa antibiotics   Social History   Socioeconomic History   Marital status: Married    Spouse name: Not on file   Number of children: Not on file   Years of education: Not on file   Highest education level: Not on file  Occupational History   Not on file  Tobacco Use   Smoking status: Never   Smokeless tobacco: Never  Substance and Sexual Activity   Alcohol use: Not on file   Drug use: Not on file   Sexual activity: Not on file  Other  Topics Concern   Not on file  Social History Narrative   Not on file   Social Determinants of Health   Financial Resource Strain: Not on file  Food Insecurity: Not on file  Transportation Needs: Not on file  Physical Activity: Not on file  Stress: Not on file  Social Connections: Not on file     Family History: The patient's family history includes COPD in his brother; Heart disease in his brother, brother, maternal grandfather, maternal grandmother, mother, paternal grandfather, and paternal grandmother; Neuropathy in his brother. There is no history of Hypertension, Diabetes, or Cancer.  ROS:   Please see the history of present illness.    All other systems reviewed and are negative.  EKGs/Labs/Other Studies Reviewed:    The following studies were reviewed  today: EKG reveals sinus rhythm and nonspecific ST-T changes   Recent Labs: No results found for requested labs within last 365 days.  Recent Lipid Panel No results found for: "CHOL", "TRIG", "HDL", "CHOLHDL", "VLDL", "LDLCALC", "LDLDIRECT"  Physical Exam:    VS:  BP (!) 90/58 (BP Location: Right Arm, Patient Position: Sitting, Cuff Size: Normal)   Pulse 90   Ht '6\' 2"'$  (1.88 m)   Wt 225 lb (102.1 kg)   SpO2 94%   BMI 28.89 kg/m     Wt Readings from Last 3 Encounters:  06/30/22 225 lb (102.1 kg)  11/25/21 225 lb (102.1 kg)  11/11/21 225 lb 9.6 oz (102.3 kg)     GEN: Patient is in no acute distress HEENT: Normal NECK: No JVD; No carotid bruits LYMPHATICS: No lymphadenopathy CARDIAC: Hear sounds regular, 2/6 systolic murmur at the apex. RESPIRATORY:  Clear to auscultation without rales, wheezing or rhonchi  ABDOMEN: Soft, non-tender, non-distended MUSCULOSKELETAL:  No edema; No deformity  SKIN: Warm and dry NEUROLOGIC:  Alert and oriented x 3 PSYCHIATRIC:  Normal affect   Signed, Christopher Lindau, MD  06/30/2022 4:07 PM    [facility]
